# Patient Record
Sex: Female | Born: 1975 | Race: Black or African American | Hispanic: No | Marital: Single | Smoking: Never smoker
Health system: Southern US, Community
[De-identification: ages and names within clinical notes are randomized; demographics above are authoritative.]

## PROBLEM LIST (undated history)

## (undated) ENCOUNTER — Emergency Department (HOSPITAL_COMMUNITY): Admission: EM | Payer: PRIVATE HEALTH INSURANCE | Source: Home / Self Care

## (undated) HISTORY — PX: TUBAL LIGATION: SHX77

---

## 2014-07-20 ENCOUNTER — Encounter (HOSPITAL_COMMUNITY): Payer: Self-pay | Admitting: Emergency Medicine

## 2014-07-20 ENCOUNTER — Emergency Department (HOSPITAL_COMMUNITY)
Admission: EM | Admit: 2014-07-20 | Discharge: 2014-07-21 | Disposition: A | Discharge status: Home / Self Care | Payer: No Typology Code available for payment source | Attending: Emergency Medicine | Admitting: Emergency Medicine

## 2014-07-20 DIAGNOSIS — H02843 Edema of right eye, unspecified eyelid: Secondary | ICD-10-CM | POA: Diagnosis not present

## 2014-07-20 DIAGNOSIS — H02846 Edema of left eye, unspecified eyelid: Secondary | ICD-10-CM | POA: Insufficient documentation

## 2014-07-20 DIAGNOSIS — R220 Localized swelling, mass and lump, head: Secondary | ICD-10-CM | POA: Insufficient documentation

## 2014-07-20 DIAGNOSIS — T7840XA Allergy, unspecified, initial encounter: Secondary | ICD-10-CM

## 2014-07-20 MED ORDER — FAMOTIDINE IN NACL 20-0.9 MG/50ML-% IV SOLN
20.0000 mg | Freq: Once | INTRAVENOUS | Status: AC
  Administered 2014-07-20: 20 mg via INTRAVENOUS
  Filled 2014-07-20: qty 50

## 2014-07-20 MED ORDER — METHYLPREDNISOLONE SODIUM SUCC 125 MG IJ SOLR
125.0000 mg | Freq: Once | INTRAMUSCULAR | Status: AC
  Administered 2014-07-20: 125 mg via INTRAVENOUS
  Filled 2014-07-20: qty 2

## 2014-07-20 MED ORDER — DIPHENHYDRAMINE HCL 50 MG/ML IJ SOLN
25.0000 mg | Freq: Once | INTRAMUSCULAR | Status: AC
  Administered 2014-07-20: 25 mg via INTRAVENOUS
  Filled 2014-07-20: qty 1

## 2014-07-20 NOTE — ED Provider Notes (Signed)
CSN: [id number]     Arrival date & time 07/20/14  2248 History  This chart was scribed for  Marisa Severin, MD by Bethel Born, ED Scribe. This patient was seen in room B16C/B16C and the patient's care was started at 11:35 PM.  The history is provided by the patient. No language interpreter was used.   Tabitha Ritter is a 39 y.o. female who presents to the Emergency Department complaining of an allergic reaction with onset around 4 PM today. Associated symptoms include angioedema. She took a BC powder to treat the symptoms. The pt associates her symptoms with fumes from cleaning chemicals being used at her residence. She has not used any new medication or been bitten by any insects other than mosquitos. Prior to the onset of symptoms she ate a "[employer]" sandwich and baked beans which she has eaten in the past without issue. Pt denies hives, nausea, SOB, and swelling of the tongue or throat. She is breathing and swallowing normally.    History reviewed. No pertinent past medical history. Past Surgical History  Procedure Laterality Date  . Tubal ligation     No family history on file. History  Substance Use Topics  . Smoking status: Never Smoker   . Smokeless tobacco: Not on file  . Alcohol Use: No   OB History    No data available     Review of Systems  Constitutional: Negative for fever, chills, diaphoresis, activity change, appetite change and fatigue.  HENT: Negative for congestion, facial swelling, rhinorrhea and sore throat.        Lip swelling  Eyes: Negative for photophobia and discharge.       Bilateral eye swelling  Respiratory: Negative for cough, chest tightness and shortness of breath.   Cardiovascular: Negative for chest pain, palpitations and leg swelling.  Gastrointestinal: Negative for nausea, vomiting, abdominal pain and diarrhea.  Endocrine: Negative for polydipsia and polyuria.  Genitourinary: Negative for dysuria, frequency, difficulty urinating and pelvic pain.   Musculoskeletal: Negative for back pain, arthralgias, neck pain and neck stiffness.  Skin: Negative for color change and wound.  Allergic/Immunologic: Negative for immunocompromised state.  Neurological: Negative for facial asymmetry, weakness, numbness and headaches.  Hematological: Does not bruise/bleed easily.  Psychiatric/Behavioral: Negative for confusion and agitation.      Allergies  Review of patient's allergies indicates no known allergies.  Home Medications   Prior to Admission medications   Not on File   Triage Vitals: BP 119/85 mmHg  Pulse 76  Temp(Src) 98.4 F (36.9 C) (Oral)  Resp 14  Ht  (1.6 m)  Wt 205 lb (92.987 kg)  BMI 36.32 kg/m2  SpO2 100%  LMP 06/30/2014 Physical Exam  Constitutional: She is oriented to person, place, and time. She appears well-developed and well-nourished. No distress.  HENT:  Head: Normocephalic and atraumatic.  Right Ear: External ear normal.  Left Ear: External ear normal.  Nose: Nose normal.  Mouth/Throat: Oropharynx is clear and moist.  Patient has small amount of swelling to right upper lip  Eyes: Conjunctivae and EOM are normal. Pupils are equal, round, and reactive to light.  Neck: Normal range of motion. Neck supple. No JVD present. No tracheal deviation present. No thyromegaly present.  Cardiovascular: Normal rate, regular rhythm, normal heart sounds and intact distal pulses.  Exam reveals no gallop and no friction rub.   No murmur heard. Pulmonary/Chest: Effort normal and breath sounds normal. No stridor. No respiratory distress. She has no wheezes. She has no  rales. She exhibits no tenderness.  Abdominal: Soft. Bowel sounds are normal. She exhibits no distension and no mass. There is no tenderness. There is no rebound and no guarding.  Musculoskeletal: Normal range of motion. She exhibits no edema or tenderness.  Lymphadenopathy:    She has no cervical adenopathy.  Neurological: She is alert and oriented to  person, place, and time. She displays normal reflexes. She exhibits normal muscle tone. Coordination normal.  Skin: Skin is warm and dry. No rash noted. No erythema. No pallor.  Psychiatric: She has a normal mood and affect. Her behavior is normal. Judgment and thought content normal.  Nursing note and vitals reviewed.   ED Course  Procedures   DIAGNOSTIC STUDIES: Oxygen Saturation is 100% on RA, normal by my interpretation.    COORDINATION OF CARE: 11:37 PM Discussed treatment plan which includes Benadryl, Pepcid, and Solu-Medrol with pt at bedside and pt agreed to plan.  2:07 AM I re-evaluated the patient and provided an update on the treatment plan including discharge to f/u with PCP with prednisone, Benadryl, Pepcid, and Zyrtec.   Labs Review Labs Reviewed - No data to display  Imaging Review No results found.   EKG Interpretation None      MDM   Final diagnoses:  Allergic reaction, initial encounter    I personally performed the services described in this documentation, which was scribed in my presence. The recorded information has been reviewed and is accurate.  39 year old female with right upper lip swelling, no prior history.  No ACE inhibitor's.  No tongue or airway involvement, no hives.  Will treat with cocktail of Benadryl Pepcid Solu-Medrol.  Symptoms have been ongoing since around 3:00, she appears to be in no acute distress.     Marisa Severinlga Antonea Gaut, MD 07/21/14 [phone]

## 2014-07-20 NOTE — ED Notes (Signed)
Pt. reports right upper lip swelling with mild itching  / right facial swelling onset this evening , denies injury , respirations unlabored/ airway intact .

## 2014-07-20 NOTE — ED Notes (Signed)
MD at bedside. 

## 2014-07-21 MED ORDER — CETIRIZINE HCL 10 MG PO TABS
10.0000 mg | ORAL_TABLET | Freq: Every day | ORAL | Status: DC
Start: 2014-07-21 — End: 2019-07-01

## 2014-07-21 MED ORDER — DIPHENHYDRAMINE HCL 25 MG PO TABS: 25.0000 mg | ORAL_TABLET | Freq: Four times a day (QID) | ORAL | Status: DC

## 2014-07-21 MED ORDER — FAMOTIDINE 20 MG PO TABS: 20.0000 mg | ORAL_TABLET | Freq: Two times a day (BID) | ORAL | Status: DC

## 2014-07-21 MED ORDER — PREDNISONE 20 MG PO TABS: 60.0000 mg | ORAL_TABLET | Freq: Every day | ORAL | Status: DC

## 2014-07-21 NOTE — Discharge Instructions (Signed)
Use zyrtec during day, benadryl at night.  Take prednisone and pepcid as prescribed.  Follow up with your doctor for recheck this week.  You may need allergy testing.   Allergies Allergies may happen from anything your body is sensitive to. This may be food, medicines, pollens, chemicals, and nearly anything around you in everyday life that produces allergens. An allergen is anything that causes an allergy producing substance. Heredity is often a factor in causing these problems. This means you may have some of the same allergies as your parents. Food allergies happen in all age groups. Food allergies are some of the most severe and life threatening. Some common food allergies are cow's milk, seafood, eggs, nuts, wheat, and soybeans. SYMPTOMS   Swelling around the mouth.  An itchy red rash or hives.  Vomiting or diarrhea.  Difficulty breathing. SEVERE ALLERGIC REACTIONS ARE LIFE-THREATENING. This reaction is called anaphylaxis. It can cause the mouth and throat to swell and cause difficulty with breathing and swallowing. In severe reactions only a trace amount of food (for example, peanut oil in a salad) may cause death within seconds. Seasonal allergies occur in all age groups. These are seasonal because they usually occur during the same season every year. They may be a reaction to molds, grass pollens, or tree pollens. Other causes of problems are house dust mite allergens, pet dander, and mold spores. The symptoms often consist of nasal congestion, a runny itchy nose associated with sneezing, and tearing itchy eyes. There is often an associated itching of the mouth and ears. The problems happen when you come in contact with pollens and other allergens. Allergens are the particles in the air that the body reacts to with an allergic reaction. This causes you to release allergic antibodies. Through a chain of events, these eventually cause you to release histamine into the blood stream. Although it  is meant to be protective to the body, it is this release that causes your discomfort. This is why you were given anti-histamines to feel better. If you are unable to pinpoint the offending allergen, it may be determined by skin or blood testing. Allergies cannot be cured but can be controlled with medicine. Hay fever is a collection of all or some of the seasonal allergy problems. It may often be treated with simple over-the-counter medicine such as diphenhydramine. Take medicine as directed. Do not drink alcohol or drive while taking this medicine. Check with your caregiver or package insert for child dosages. If these medicines are not effective, there are many new medicines your caregiver can prescribe. Stronger medicine such as nasal spray, eye drops, and corticosteroids may be used if the first things you try do not work well. Other treatments such as immunotherapy or desensitizing injections can be used if all else fails. Follow up with your caregiver if problems continue. These seasonal allergies are usually not life threatening. They are generally more of a nuisance that can often be handled using medicine. HOME CARE INSTRUCTIONS   If unsure what causes a reaction, keep a diary of foods eaten and symptoms that follow. Avoid foods that cause reactions.  If hives or rash are present:  Take medicine as directed.  You may use an over-the-counter antihistamine (diphenhydramine) for hives and itching as needed.  Apply cold compresses (cloths) to the skin or take baths in cool water. Avoid hot baths or showers. Heat will make a rash and itching worse.  If you are severely allergic:  Following a treatment for a  severe reaction, hospitalization is often required for closer follow-up.  Wear a medic-alert bracelet or necklace stating the allergy.  You and your family must learn how to give adrenaline or use an anaphylaxis kit.  If you have had a severe reaction, always carry your anaphylaxis  kit or EpiPen with you. Use this medicine as directed by your caregiver if a severe reaction is occurring. Failure to do so could have a fatal outcome. SEEK MEDICAL CARE IF:  You suspect a food allergy. Symptoms generally happen within 30 minutes of eating a food.  Your symptoms have not gone away within 2 days or are getting worse.  You develop new symptoms.  You want to retest yourself or your child with a food or drink you think causes an allergic reaction. Never do this if an anaphylactic reaction to that food or drink has happened before. Only do this under the care of a caregiver. SEEK IMMEDIATE MEDICAL CARE IF:   You have difficulty breathing, are wheezing, or have a tight feeling in your chest or throat.  You have a swollen mouth, or you have hives, swelling, or itching all over your body.  You have had a severe reaction that has responded to your anaphylaxis kit or an EpiPen. These reactions may return when the medicine has worn off. These reactions should be considered life threatening. MAKE SURE YOU:   Understand these instructions.  Will watch your condition.  Will get help right away if you are not doing well or get worse. Document Released: 03/27/2002 Document Revised: 04/28/2012 Document Reviewed: 09/01/2007 [facility] Patient Information 2015 [place], [place]. This information is not intended to replace advice given to you by your health care provider. Make sure you discuss any questions you have with your health care provider.

## 2017-12-01 ENCOUNTER — Emergency Department (HOSPITAL_BASED_OUTPATIENT_CLINIC_OR_DEPARTMENT_OTHER)
Admission: EM | Admit: 2017-12-01 | Discharge: 2017-12-01 | Disposition: A | Discharge status: Home / Self Care | Payer: BLUE CROSS/BLUE SHIELD | Attending: Emergency Medicine | Admitting: Emergency Medicine

## 2017-12-01 ENCOUNTER — Encounter (HOSPITAL_BASED_OUTPATIENT_CLINIC_OR_DEPARTMENT_OTHER): Payer: Self-pay | Admitting: Emergency Medicine

## 2017-12-01 ENCOUNTER — Other Ambulatory Visit: Payer: Self-pay

## 2017-12-01 DIAGNOSIS — J029 Acute pharyngitis, unspecified: Secondary | ICD-10-CM | POA: Insufficient documentation

## 2017-12-01 DIAGNOSIS — R0981 Nasal congestion: Secondary | ICD-10-CM | POA: Diagnosis not present

## 2017-12-01 DIAGNOSIS — H9203 Otalgia, bilateral: Secondary | ICD-10-CM

## 2017-12-01 DIAGNOSIS — H9209 Otalgia, unspecified ear: Secondary | ICD-10-CM | POA: Diagnosis present

## 2017-12-01 LAB — GROUP A STREP BY PCR: Group A Strep by PCR: NOT DETECTED

## 2017-12-01 MED ORDER — IBUPROFEN 800 MG PO TABS
800.0000 mg | ORAL_TABLET | Freq: Once | ORAL | Status: AC
  Administered 2017-12-01: 800 mg via ORAL
  Filled 2017-12-01: qty 1

## 2017-12-01 MED ORDER — LORATADINE-PSEUDOEPHEDRINE ER 5-120 MG PO TB12: 1.0000 | ORAL_TABLET | Freq: Two times a day (BID) | ORAL | 0 refills | Status: DC

## 2017-12-01 MED ORDER — IBUPROFEN 800 MG PO TABS: 800.0000 mg | ORAL_TABLET | Freq: Three times a day (TID) | ORAL | 0 refills | Status: DC

## 2017-12-01 MED ORDER — TRAMADOL HCL 50 MG PO TABS
50.0000 mg | ORAL_TABLET | Freq: Four times a day (QID) | ORAL | 0 refills | Status: DC | PRN
Start: 2017-12-01 — End: 2019-07-01

## 2017-12-01 NOTE — ED Provider Notes (Signed)
[facility] EMERGENCY DEPARTMENT Provider Note   CSN: [id number] Arrival date & time: 12/01/17  0850     History   Chief Complaint Chief Complaint  Patient presents with  . Sore Throat  . Otalgia    HPI Tabitha Ritter is a 42 y.o. female.  HPI Patient reports that she started to getting earache and sore throat yesterday.  Both ears are achy.  They feel like there is pressure.  She reports the sore throat is burning in quality.  More painful with swallowing.  Minimal associated nasal congestion.  No fever.  No generalized body ache.  No cough no shortness of breath.  No nausea no vomiting.  Patient does work in a healthcare environment with elderly patients.  She does not have any known sick contact that she is aware of.  She does get her annual influenza shot. History reviewed. No pertinent past medical history.  There are no active problems to display for this patient.   Past Surgical History:  Procedure Laterality Date  . TUBAL LIGATION       OB History   None      Home Medications    Prior to Admission medications   Medication Sig Start Date End Date Taking? Authorizing Provider  cetirizine (ZYRTEC ALLERGY) 10 MG tablet Take 1 tablet (10 mg total) by mouth daily. 07/21/14   Marisa Severin, MD  diphenhydrAMINE (BENADRYL) 25 MG tablet Take 1 tablet (25 mg total) by mouth every 6 (six) hours. 07/21/14   Marisa Severin, MD  famotidine (PEPCID) 20 MG tablet Take 1 tablet (20 mg total) by mouth 2 (two) times daily. 07/21/14   Marisa Severin, MD  ibuprofen (ADVIL,MOTRIN) 800 MG tablet Take 1 tablet (800 mg total) by mouth 3 (three) times daily. 12/01/17   Arby Barrette, MD  loratadine-pseudoephedrine (CLARITIN-D 12 HOUR) 5-120 MG tablet Take 1 tablet by mouth 2 (two) times daily. 12/01/17   Arby Barrette, MD  predniSONE (DELTASONE) 20 MG tablet Take 3 tablets (60 mg total) by mouth daily. 07/21/14   Marisa Severin, MD  traMADol (ULTRAM) 50 MG tablet Take 1 tablet (50 mg  total) by mouth every 6 (six) hours as needed. 12/01/17   Arby Barrette, MD    Family History No family history on file.  Social History Social History   Tobacco Use  . Smoking status: Never Smoker  . Smokeless tobacco: Never Used  Substance Use Topics  . Alcohol use: No  . Drug use: No     Allergies   Patient has no known allergies.   Review of Systems Review of Systems 10 Systems reviewed and are negative for acute change except as noted in the HPI.   Physical Exam Updated Vital Signs BP 124/85 (BP Location: Right Arm)   Pulse 97   Temp 98 F (36.7 C) (Oral)   Resp 14   Ht 5\' 3"  (1.6 m)   Wt 96.2 kg   LMP 11/07/2017   SpO2 100%   BMI 37.55 kg/m   Physical Exam  Constitutional: She is oriented to person, place, and time. She appears well-developed and well-nourished. No distress.  HENT:  Head: Normocephalic and atraumatic.  Bilateral TMs are normal.  Mucous membranes are pink and moist.  Posterior oropharynx is widely patent.  No erythema no exudate.  Neck is supple.  Eyes: Pupils are equal, round, and reactive to light. EOM are normal.  Neck: Neck supple.  Cardiovascular: Normal rate, regular rhythm and normal heart sounds.  Pulmonary/Chest:  Effort normal and breath sounds normal.  Abdominal: Soft. She exhibits no distension. There is no tenderness.  Musculoskeletal: Normal range of motion.  Neurological: She is alert and oriented to person, place, and time. She exhibits normal muscle tone. Coordination normal.  Skin: Skin is warm and dry.  Psychiatric: She has a normal mood and affect.     ED Treatments / Results  Labs (all labs ordered are listed, but only abnormal results are displayed) Labs Reviewed  GROUP A STREP BY PCR    EKG None  Radiology No results found.  Procedures Procedures (including critical care time)  Medications Ordered in ED Medications  ibuprofen (ADVIL,MOTRIN) tablet 800 mg (800 mg Oral Given 12/01/17 0958)      Initial Impression / Assessment and Plan / ED Course  I have reviewed the triage vital signs and the nursing notes.  Pertinent labs & imaging results that were available during my care of the patient were reviewed by me and considered in my medical decision making (see chart for details).    Rapid strep is negative.  Patient does not have findings positive for otitis media on exam.  Her throat is clear.  At this time have high suspicion for viral etiology.  Patient does not have fever or generalized myalgia.  Will initiate symptomatic treatment with combination of ibuprofen Claritin-D and tramadol as needed.  Return precautions reviewed.  Patient is counseled to follow-up with her PCP.  Final Clinical Impressions(s) / ED Diagnoses   Final diagnoses:  Pharyngitis, unspecified etiology  Otalgia of both ears    ED Discharge Orders         Ordered    ibuprofen (ADVIL,MOTRIN) 800 MG tablet  3 times daily     12/01/17 1042    traMADol (ULTRAM) 50 MG tablet  Every 6 hours PRN     12/01/17 1042    loratadine-pseudoephedrine (CLARITIN-D 12 HOUR) 5-120 MG tablet  2 times daily     12/01/17 1042           Arby BarrettePfeiffer, Alba Kriesel, MD 12/01/17 1046

## 2017-12-01 NOTE — ED Triage Notes (Signed)
Pt c/o pain to B/L ears and sore throat onset yesterday. Pt has not tried any medication for symptoms.

## 2019-01-07 ENCOUNTER — Other Ambulatory Visit: Payer: Self-pay

## 2019-01-07 ENCOUNTER — Encounter (HOSPITAL_BASED_OUTPATIENT_CLINIC_OR_DEPARTMENT_OTHER): Payer: Self-pay

## 2019-01-07 ENCOUNTER — Emergency Department (HOSPITAL_BASED_OUTPATIENT_CLINIC_OR_DEPARTMENT_OTHER)
Admission: EM | Admit: 2019-01-07 | Discharge: 2019-01-07 | Disposition: A | Discharge status: Home / Self Care | Payer: BLUE CROSS/BLUE SHIELD | Attending: Emergency Medicine | Admitting: Emergency Medicine

## 2019-01-07 DIAGNOSIS — M7989 Other specified soft tissue disorders: Secondary | ICD-10-CM

## 2019-01-07 DIAGNOSIS — Z79899 Other long term (current) drug therapy: Secondary | ICD-10-CM | POA: Insufficient documentation

## 2019-01-07 DIAGNOSIS — R2232 Localized swelling, mass and lump, left upper limb: Secondary | ICD-10-CM | POA: Insufficient documentation

## 2019-01-07 MED ORDER — IBUPROFEN 800 MG PO TABS
800.0000 mg | ORAL_TABLET | Freq: Once | ORAL | Status: AC
  Administered 2019-01-07: 800 mg via ORAL
  Filled 2019-01-07: qty 1

## 2019-01-07 MED ORDER — CLINDAMYCIN HCL 150 MG PO CAPS
300.0000 mg | ORAL_CAPSULE | Freq: Once | ORAL | Status: AC
  Administered 2019-01-07: 300 mg via ORAL
  Filled 2019-01-07: qty 2

## 2019-01-07 MED ORDER — IBUPROFEN 600 MG PO TABS: 600.0000 mg | ORAL_TABLET | Freq: Four times a day (QID) | ORAL | 0 refills | Status: DC | PRN

## 2019-01-07 MED ORDER — OXYCODONE-ACETAMINOPHEN 5-325 MG PO TABS: 2.0000 | ORAL_TABLET | ORAL | 0 refills | Status: DC | PRN

## 2019-01-07 MED ORDER — CLINDAMYCIN HCL 150 MG PO CAPS: 300.0000 mg | ORAL_CAPSULE | Freq: Three times a day (TID) | ORAL | 0 refills | Status: AC

## 2019-01-07 MED FILL — IBUPROFEN 600 MG TABLET: 600 | 7 days supply | Qty: 30 | Fill #0

## 2019-01-07 MED FILL — CLINDAMYCIN HCL 150 MG CAPS: 150 | 7 days supply | Qty: 42 | Fill #0

## 2019-01-07 MED FILL — OXYCODONE-ACETAMINOPHEN 5-3: 5-325 | 1 days supply | Qty: 5 | Fill #0

## 2019-01-07 NOTE — ED Triage Notes (Signed)
Pt states that she has had a boil under her left axilla X 3-4 works. States that she has been using warm compresses but on Saturday the area became more pain painful.

## 2019-01-07 NOTE — ED Provider Notes (Signed)
[facility] EMERGENCY DEPARTMENT Provider Note   CSN: [id number] Arrival date & time: 01/07/19  5573     History Chief Complaint  Patient presents with  . Recurrent Skin Infections    Tabitha Ritter is a 43 y.o. female presented to the ED with pain and swelling under her left armpit.  She reports onset of symptoms about 3 weeks ago.  She has been attempting warm compresses at home but feels like the swelling is just getting larger.  She said is very painful with any movement of her arm.  She denies any fevers or chills.  She does get 1 similar issue in the past with a "boil" under her armpit after shaving her armpits.  She has not recently been shaving her armpits.  She has no other history of boils or infections.  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   HPI     History reviewed. No pertinent past medical history.  There are no problems to display for this patient.   Past Surgical History:  Procedure Laterality Date  . TUBAL LIGATION       OB History   No obstetric history on file.     History reviewed. No pertinent family history.  Social History   Tobacco Use  . Smoking status: Never Smoker  . Smokeless tobacco: Never Used  Substance Use Topics  . Alcohol use: No  . Drug use: No    Home Medications Prior to Admission medications   Medication Sig Start Date End Date Taking? Authorizing Provider  cetirizine (ZYRTEC ALLERGY) 10 MG tablet Take 1 tablet (10 mg total) by mouth daily. 07/21/14   Marisa Severin, MD  clindamycin (CLEOCIN) 150 MG capsule Take 2 capsules (300 mg total) by mouth 3 (three) times daily for 7 days. 01/07/19 01/14/19  Terald Sleeper, MD  diphenhydrAMINE (BENADRYL) 25 MG tablet Take 1 tablet (25 mg total) by mouth every 6 (six) hours. 07/21/14   Marisa Severin, MD  famotidine (PEPCID) 20 MG tablet Take 1 tablet (20 mg total) by mouth 2 (two) times daily. 07/21/14   Marisa Severin, MD  ibuprofen (ADVIL) 600 MG tablet Take 1  tablet (600 mg total) by mouth every 6 (six) hours as needed for up to 30 doses. 01/07/19   Terald Sleeper, MD  ibuprofen (ADVIL,MOTRIN) 800 MG tablet Take 1 tablet (800 mg total) by mouth 3 (three) times daily. 12/01/17   Arby Barrette, MD  loratadine-pseudoephedrine (CLARITIN-D 12 HOUR) 5-120 MG tablet Take 1 tablet by mouth 2 (two) times daily. 12/01/17   Arby Barrette, MD  oxyCODONE-acetaminophen (PERCOCET/ROXICET) 5-325 MG tablet Take 2 tablets by mouth every 4 (four) hours as needed for up to 5 doses for severe pain. 01/07/19   Terald Sleeper, MD  predniSONE (DELTASONE) 20 MG tablet Take 3 tablets (60 mg total) by mouth daily. 07/21/14   Marisa Severin, MD  traMADol (ULTRAM) 50 MG tablet Take 1 tablet (50 mg total) by mouth every 6 (six) hours as needed. 12/01/17   Arby Barrette, MD    Allergies    Patient has no known allergies.  Review of Systems   Review of Systems  Constitutional: Negative for chills and fever.  Respiratory: Negative for cough and shortness of breath.   Cardiovascular: Negative for chest pain and palpitations.  Gastrointestinal: Negative for nausea and vomiting.  Musculoskeletal: Positive for arthralgias and myalgias.  Skin: Positive for wound. Negative for rash.  Psychiatric/Behavioral: Negative for agitation  and confusion.  All other systems reviewed and are negative.   Physical Exam Updated Vital Signs BP 132/88 (BP Location: Right Arm)   Pulse 91   Temp 97.9 F (36.6 C) (Oral)   Resp 14   Ht 5\' 3"  (1.6 m)   Wt 96.2 kg   LMP 01/03/2019   SpO2 100%   BMI 37.55 kg/m   Physical Exam Vitals and nursing note reviewed.  Constitutional:      General: She is not in acute distress.    Appearance: She is well-developed.  HENT:     Head: Normocephalic and atraumatic.  Eyes:     Conjunctiva/sclera: Conjunctivae normal.  Cardiovascular:     Rate and Rhythm: Normal rate and regular rhythm.  Pulmonary:     Effort: Pulmonary effort is normal. No  respiratory distress.  Musculoskeletal:     Cervical back: Neck supple.  Skin:    General: Skin is warm and dry.     Comments: Large tender warm nodule of the left axilla  Neurological:     Mental Status: She is alert.  Psychiatric:        Mood and Affect: Mood normal.        Behavior: Behavior normal.     ED Results / Procedures / Treatments   Labs (all labs ordered are listed, but only abnormal results are displayed) Labs Reviewed - No data to display  EKG None  Radiology No results found.  Procedures Ultrasound ED Soft Tissue  Date/Time: 01/07/2019 8:20 AM Performed by: Wyvonnia Dusky, MD Authorized by: Wyvonnia Dusky, MD   Procedure details:    Indications: localization of abscess and evaluate for cellulitis     Transverse view:  Visualized   Longitudinal view:  Visualized   Images: archived   Location:    Location: axilla     Side:  Left Comments:     Mixed hypo and hyperechoic fluid structure identified with vascular flow most consistent with lymph node   (including critical care time)  Medications Ordered in ED Medications  clindamycin (CLEOCIN) capsule 300 mg (300 mg Oral Given 01/07/19 0830)  ibuprofen (ADVIL) tablet 800 mg (800 mg Oral Given 01/07/19 0830)    ED Course  I have reviewed the triage vital signs and the nursing notes.  Pertinent labs & imaging results that were available during my care of the patient were reviewed by me and considered in my medical decision making (see chart for details).  This is a 43 year old female presenting with pain in her left armpit for about 3 weeks.  She has a large tender nodule in her left axilla; it is unclear from physical exam alone whether this is truly an abscess or if it is a reactive lymph node.  However, bedside ultrasound did show that there was some vascular flow into this structure, which is more concerning for lymphadenitis.  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     Final Clinical Impression(s) / ED Diagnoses Final diagnoses:  Swelling in left armpit    Rx / DC Orders ED Discharge Orders         Ordered    clindamycin (CLEOCIN) 150 MG capsule  3 times daily     01/07/19  [id number]    ibuprofen (ADVIL) 600 MG tablet  Every 6 hours PRN     01/07/19 0826    oxyCODONE-acetaminophen (PERCOCET/ROXICET) 5-325 MG tablet  Every 4 hours PRN     01/07/19 0826           Terald Sleeperrifan, Om Lizotte J, MD 01/07/19 [phone]

## 2019-01-07 NOTE — Discharge Instructions (Signed)
You were seen in the ED for pain and swelling under your left armpit.  He had an ultrasound done in the ED.  As I explained, based on the ultrasound, I was concerned that this may in fact be a reactive lymph node and not an abscess.  I decided not to perform incision and drainage in the emergency department.  Instead I prescribed you antibiotics and some anti-inflammatories to take for a week.  I advised that you follow-up with a general surgeon if you continue to have persistent swelling.

## 2019-01-19 ENCOUNTER — Emergency Department (HOSPITAL_BASED_OUTPATIENT_CLINIC_OR_DEPARTMENT_OTHER): Payer: BLUE CROSS/BLUE SHIELD

## 2019-01-19 ENCOUNTER — Encounter (HOSPITAL_BASED_OUTPATIENT_CLINIC_OR_DEPARTMENT_OTHER): Payer: Self-pay | Admitting: Emergency Medicine

## 2019-01-19 ENCOUNTER — Emergency Department (HOSPITAL_BASED_OUTPATIENT_CLINIC_OR_DEPARTMENT_OTHER)
Admission: EM | Admit: 2019-01-19 | Discharge: 2019-01-19 | Disposition: A | Discharge status: Home / Self Care | Payer: BLUE CROSS/BLUE SHIELD | Attending: Emergency Medicine | Admitting: Emergency Medicine

## 2019-01-19 ENCOUNTER — Other Ambulatory Visit: Payer: Self-pay

## 2019-01-19 DIAGNOSIS — U071 COVID-19: Secondary | ICD-10-CM | POA: Diagnosis not present

## 2019-01-19 DIAGNOSIS — Z79899 Other long term (current) drug therapy: Secondary | ICD-10-CM | POA: Diagnosis not present

## 2019-01-19 DIAGNOSIS — R05 Cough: Secondary | ICD-10-CM | POA: Diagnosis present

## 2019-01-19 DIAGNOSIS — R42 Dizziness and giddiness: Secondary | ICD-10-CM | POA: Diagnosis not present

## 2019-01-19 DIAGNOSIS — R519 Headache, unspecified: Secondary | ICD-10-CM | POA: Diagnosis not present

## 2019-01-19 DIAGNOSIS — W19XXXA Unspecified fall, initial encounter: Secondary | ICD-10-CM

## 2019-01-19 LAB — CBC WITH DIFFERENTIAL/PLATELET
Abs Immature Granulocytes: 0.02 10*3/uL (ref 0.00–0.07)
Basophils Absolute: 0 10*3/uL (ref 0.0–0.1)
Basophils Relative: 0 %
Eosinophils Absolute: 0 10*3/uL (ref 0.0–0.5)
Eosinophils Relative: 0 %
HCT: 42.8 % (ref 36.0–46.0)
Hemoglobin: 13.5 g/dL (ref 12.0–15.0)
Immature Granulocytes: 0 %
Lymphocytes Relative: 24 %
Lymphs Abs: 1.6 10*3/uL (ref 0.7–4.0)
MCH: 21.9 pg — ABNORMAL LOW (ref 26.0–34.0)
MCHC: 31.5 g/dL (ref 30.0–36.0)
MCV: 69.5 fL — ABNORMAL LOW (ref 80.0–100.0)
Monocytes Absolute: 0.4 10*3/uL (ref 0.1–1.0)
Monocytes Relative: 6 %
Neutro Abs: 4.7 10*3/uL (ref 1.7–7.7)
Neutrophils Relative %: 70 %
Platelets: 267 10*3/uL (ref 150–400)
RBC: 6.16 MIL/uL — ABNORMAL HIGH (ref 3.87–5.11)
RDW: 14.2 % (ref 11.5–15.5)
Smear Review: NORMAL
WBC: 6.7 10*3/uL (ref 4.0–10.5)
nRBC: 0 % (ref 0.0–0.2)

## 2019-01-19 LAB — COMPREHENSIVE METABOLIC PANEL
ALT: 22 U/L (ref 0–44)
AST: 67 U/L — ABNORMAL HIGH (ref 15–41)
Albumin: 4.1 g/dL (ref 3.5–5.0)
Alkaline Phosphatase: 30 U/L — ABNORMAL LOW (ref 38–126)
Anion gap: 13 (ref 5–15)
BUN: 14 mg/dL (ref 6–20)
CO2: 27 mmol/L (ref 22–32)
Calcium: 8.8 mg/dL — ABNORMAL LOW (ref 8.9–10.3)
Chloride: 93 mmol/L — ABNORMAL LOW (ref 98–111)
Creatinine, Ser: 1.1 mg/dL — ABNORMAL HIGH (ref 0.44–1.00)
GFR calc Af Amer: >60 mL/min (ref >60)
GFR calc non Af Amer: >60 mL/min (ref >60)
Glucose, Bld: 102 mg/dL — ABNORMAL HIGH (ref 70–99)
Potassium: 3.8 mmol/L (ref 3.5–5.1)
Sodium: 133 mmol/L — ABNORMAL LOW (ref 135–145)
Total Bilirubin: 1.5 mg/dL — ABNORMAL HIGH (ref 0.3–1.2)
Total Protein: 8.9 g/dL — ABNORMAL HIGH (ref 6.5–8.1)

## 2019-01-19 MED ORDER — SODIUM CHLORIDE 0.9 % IV BOLUS
1000.0000 mL | Freq: Once | INTRAVENOUS | Status: AC
  Administered 2019-01-19: 1000 mL via INTRAVENOUS

## 2019-01-19 NOTE — ED Triage Notes (Signed)
she tested positive for COVID+ om 12/26 - pt reports that she fell on Friday when she was in her bedroom, the pt states that she continues to have a Headache and a cough after the fall

## 2019-01-19 NOTE — Discharge Instructions (Addendum)
We discussed the results of your xray.  Please continue to drink plenty of fluids in order to help with your symptoms.   If you experience any shortness of breath, chest pain please return to the ED.

## 2019-01-19 NOTE — ED Provider Notes (Signed)
[facility] EMERGENCY DEPARTMENT Provider Note   CSN: [id number] Arrival date & time: 01/19/19  1047     History Chief Complaint  Patient presents with  . Cough    Tabitha Ritter is a 44 y.o. female.  44 y.o female with no PMH presents to the ED with a chief complaint of headache x 2 days. Patient reports falling on the floor late at night Saturday, states she felt dizzy and struck her head on the ground. She describes a sharp pain around the back of her head with radiation throughout. She has taken ibuprofen with some improvement in symptoms, bringing her pain down to a 2. She reports body aches, fevers while at home with a T max of 99.8. She is currently on day 9 of covid 19 infection. She denies any chest pain, shortness of breath, blurry vision.   The history is provided by the patient.  Cough Cough characteristics:  Dry Sputum characteristics:  Nondescript Severity:  Moderate Onset quality:  Gradual Associated symptoms: headaches   Associated symptoms: no chest pain and no sore throat       Past Surgical History:  Procedure Laterality Date  . TUBAL LIGATION       OB History   No obstetric history on file.     History reviewed. No pertinent family history.  Social History   Tobacco Use  . Smoking status: Never Smoker  . Smokeless tobacco: Never Used  Substance Use Topics  . Alcohol use: No  . Drug use: No    Home Medications Prior to Admission medications   Medication Sig Start Date End Date Taking? Authorizing Provider  cetirizine (ZYRTEC ALLERGY) 10 MG tablet Take 1 tablet (10 mg total) by mouth daily. 07/21/14   Marisa Severin, MD  diphenhydrAMINE (BENADRYL) 25 MG tablet Take 1 tablet (25 mg total) by mouth every 6 (six) hours. 07/21/14   Marisa Severin, MD  famotidine (PEPCID) 20 MG tablet Take 1 tablet (20 mg total) by mouth 2 (two) times daily. 07/21/14   Marisa Severin, MD  ibuprofen (ADVIL) 600 MG tablet Take 1 tablet (600 mg total) by mouth every 6  (six) hours as needed for up to 30 doses. 01/07/19   Terald Sleeper, MD  ibuprofen (ADVIL,MOTRIN) 800 MG tablet Take 1 tablet (800 mg total) by mouth 3 (three) times daily. 12/01/17   Arby Barrette, MD  loratadine-pseudoephedrine (CLARITIN-D 12 HOUR) 5-120 MG tablet Take 1 tablet by mouth 2 (two) times daily. 12/01/17   Arby Barrette, MD  oxyCODONE-acetaminophen (PERCOCET/ROXICET) 5-325 MG tablet Take 2 tablets by mouth every 4 (four) hours as needed for up to 5 doses for severe pain. 01/07/19   Terald Sleeper, MD  predniSONE (DELTASONE) 20 MG tablet Take 3 tablets (60 mg total) by mouth daily. 07/21/14   Marisa Severin, MD  traMADol (ULTRAM) 50 MG tablet Take 1 tablet (50 mg total) by mouth every 6 (six) hours as needed. 12/01/17   Arby Barrette, MD    Allergies    Patient has no known allergies.  Review of Systems   Review of Systems  HENT: Negative for sore throat.   Respiratory: Positive for cough and choking. Negative for chest tightness.   Cardiovascular: Negative for chest pain.  Gastrointestinal: Negative for abdominal pain.  Genitourinary: Negative for flank pain.  Musculoskeletal: Negative for back pain.  Skin: Negative for pallor.  Neurological: Positive for headaches.    Physical Exam Updated Vital Signs BP 105/80 (BP Location: Right Arm)  Pulse (!) 119   Temp 99 F (37.2 C) (Oral)   Resp 18   Wt 96.1 kg   LMP 01/03/2019   SpO2 100%   BMI 37.54 kg/m   Physical Exam Vitals and nursing note reviewed.  Constitutional:      Appearance: Normal appearance.  HENT:     Head: Normocephalic and atraumatic.     Mouth/Throat:     Mouth: Mucous membranes are dry.  Eyes:     Pupils: Pupils are equal, round, and reactive to light.  Cardiovascular:     Rate and Rhythm: Tachycardia present.  Pulmonary:     Effort: Pulmonary effort is normal.     Breath sounds: No wheezing or rales.  Abdominal:     General: Abdomen is flat.  Musculoskeletal:     Cervical back:  Normal range of motion and neck supple.  Skin:    General: Skin is warm and dry.  Neurological:     Mental Status: She is alert and oriented to person, place, and time.     ED Results / Procedures / Treatments   Labs (all labs ordered are listed, but only abnormal results are displayed) Labs Reviewed  CBC WITH DIFFERENTIAL/PLATELET - Abnormal; Notable for the following components:      Result Value   RBC 6.16 (*)    MCV 69.5 (*)    MCH 21.9 (*)    All other components within normal limits  COMPREHENSIVE METABOLIC PANEL - Abnormal; Notable for the following components:   Sodium 133 (*)    Chloride 93 (*)    Glucose, Bld 102 (*)    Creatinine, Ser 1.10 (*)    Calcium 8.8 (*)    Total Protein 8.9 (*)    AST 67 (*)    Alkaline Phosphatase 30 (*)    Total Bilirubin 1.5 (*)    All other components within normal limits    EKG None  Radiology DG Chest Portable 1 View  Result Date: 01/19/2019 CLINICAL DATA:  COVID-19 positivity with recent fall and cough, initial encounter EXAM: PORTABLE CHEST 1 VIEW COMPARISON:  None. FINDINGS: Cardiac shadows within normal limits. Very minimal patchy opacities are noted in the right mid lung consistent with the given clinical history. No sizable effusion is seen. No bony abnormality is noted. IMPRESSION: Patchy opacities are noted consistent with the given clinical history. Electronically Signed   By: Alcide Clever M.D.   On: 01/19/2019 16:04    Procedures Procedures (including critical care time)  Medications Ordered in ED Medications  sodium chloride 0.9 % bolus 1,000 mL (1,000 mLs Intravenous New Bag/Given 01/19/19 1629)    ED Course  I have reviewed the triage vital signs and the nursing notes.  Pertinent labs & imaging results that were available during my care of the patient were reviewed by me and considered in my medical decision making (see chart for details).    MDM Rules/Calculators/A&P    Patient with no PMH presents to the  ED status post fall, patient reports she felt dizzy over the weekend and had a fall, reports she hit her head, she is currently not on any blood thinners.  She did not lose consciousness and has been ambulatory since the incident. She also tested positive for covid on 12/26, has been managing her symptoms at home. She arrived in the ED with a HR of 120 at rest, no tachypnea, no hypoxia, she is ventilating well without any increase work of breathing.   CMP showed  mild hyponatremia, mild AKI suspect likely due to dehydration. LFT's remarkable for a mild elevated AST.   Xray of the chest showed: Patchy opacities are noted consistent with the given clinical  history.      5:06 PM Patient was reevaluated by me, she reports improvement in symptoms after IV fluids encourage to continue to push fluids. Neuro exam is unremarkable.  We discussed the risks and benefits of obtaining CT imaging at this time.  Patient is agreeable of monitoring headache at home.  Heart rate has improved now to 108, she is satting at 100%, no signs of hypoxia, chest pain, fevers. Return precautions discussed at length.    Portions of this note were generated with [employer]. Dictation errors may occur despite best attempts at proofreading.  Final Clinical Impression(s) / ED Diagnoses Final diagnoses:  Fall, initial encounter  COVID-19 virus infection    Rx / DC Orders ED Discharge Orders    None       Janeece Fitting, PA-C 01/19/19 1712    Dorie Rank, MD 01/20/19 [phone]

## 2019-01-19 NOTE — ED Notes (Signed)
PA to the bedside. Pt in no noted distress

## 2019-02-10 ENCOUNTER — Other Ambulatory Visit: Payer: Self-pay

## 2019-02-10 ENCOUNTER — Emergency Department (HOSPITAL_BASED_OUTPATIENT_CLINIC_OR_DEPARTMENT_OTHER)
Admission: EM | Admit: 2019-02-10 | Discharge: 2019-02-10 | Disposition: A | Discharge status: Home / Self Care | Payer: BC Managed Care – PPO | Attending: Emergency Medicine | Admitting: Emergency Medicine

## 2019-02-10 ENCOUNTER — Encounter (HOSPITAL_BASED_OUTPATIENT_CLINIC_OR_DEPARTMENT_OTHER): Payer: Self-pay | Admitting: *Deleted

## 2019-02-10 DIAGNOSIS — Z79899 Other long term (current) drug therapy: Secondary | ICD-10-CM | POA: Diagnosis not present

## 2019-02-10 DIAGNOSIS — T50Z95A Adverse effect of other vaccines and biological substances, initial encounter: Secondary | ICD-10-CM | POA: Diagnosis not present

## 2019-02-10 DIAGNOSIS — R0602 Shortness of breath: Secondary | ICD-10-CM | POA: Diagnosis not present

## 2019-02-10 DIAGNOSIS — Z8616 Personal history of COVID-19: Secondary | ICD-10-CM | POA: Diagnosis not present

## 2019-02-10 DIAGNOSIS — M791 Myalgia, unspecified site: Secondary | ICD-10-CM | POA: Diagnosis not present

## 2019-02-10 DIAGNOSIS — R0789 Other chest pain: Secondary | ICD-10-CM | POA: Insufficient documentation

## 2019-02-10 NOTE — Discharge Instructions (Signed)
Suspect the symptoms you are having today are related to getting the Moderna vaccine as it is activating all the antibodies you had already developed from having Covid.  Take Tylenol or ibuprofen for body aches and pains.  This could last up to 24 to 36 hours.  You may need to pretreat before getting your second vaccine.

## 2019-02-10 NOTE — ED Provider Notes (Signed)
[facility] EMERGENCY DEPARTMENT Provider Note   CSN: [id number] Arrival date & time: 02/10/19  1843     History Chief Complaint  Patient presents with  . Allergic Reaction    Tabitha Ritter is a 44 y.o. female.  Patient is a 44 year old female with no significant past medical history who is presenting today with a concern that she may be having an allergic reaction.  Patient states that she got her [place] vaccine this morning but when she woke up tonight to get ready for work she felt some tightness in her chest some mild shortness of breath and some body aches.  She states the symptoms felt very similar to when she had Covid at the end of December.  She was feeling completely well prior to getting the vaccine this morning and directly after getting the vaccine she had no symptoms.  She had no flushing, rashes or chest tightness.  It was not till 12 hours after the vaccine she started having symptoms.  She states now she just has some arm soreness but the other symptoms have improved.  The history is provided by the patient.  Allergic Reaction      History reviewed. No pertinent past medical history.  There are no problems to display for this patient.   Past Surgical History:  Procedure Laterality Date  . TUBAL LIGATION       OB History   No obstetric history on file.     No family history on file.  Social History   Tobacco Use  . Smoking status: Never Smoker  . Smokeless tobacco: Never Used  Substance Use Topics  . Alcohol use: No  . Drug use: No    Home Medications Prior to Admission medications   Medication Sig Start Date End Date Taking? Authorizing Provider  cetirizine (ZYRTEC ALLERGY) 10 MG tablet Take 1 tablet (10 mg total) by mouth daily. 07/21/14   Linton Flemings, MD  diphenhydrAMINE (BENADRYL) 25 MG tablet Take 1 tablet (25 mg total) by mouth every 6 (six) hours. 07/21/14   Linton Flemings, MD  famotidine (PEPCID) 20 MG tablet Take 1 tablet (20 mg  total) by mouth 2 (two) times daily. 07/21/14   Linton Flemings, MD  ibuprofen (ADVIL) 600 MG tablet Take 1 tablet (600 mg total) by mouth every 6 (six) hours as needed for up to 30 doses. 01/07/19   Wyvonnia Dusky, MD  ibuprofen (ADVIL,MOTRIN) 800 MG tablet Take 1 tablet (800 mg total) by mouth 3 (three) times daily. 12/01/17   Charlesetta Shanks, MD  loratadine-pseudoephedrine (CLARITIN-D 12 HOUR) 5-120 MG tablet Take 1 tablet by mouth 2 (two) times daily. 12/01/17   Charlesetta Shanks, MD  oxyCODONE-acetaminophen (PERCOCET/ROXICET) 5-325 MG tablet Take 2 tablets by mouth every 4 (four) hours as needed for up to 5 doses for severe pain. 01/07/19   Wyvonnia Dusky, MD  predniSONE (DELTASONE) 20 MG tablet Take 3 tablets (60 mg total) by mouth daily. 07/21/14   Linton Flemings, MD  traMADol (ULTRAM) 50 MG tablet Take 1 tablet (50 mg total) by mouth every 6 (six) hours as needed. 12/01/17   Charlesetta Shanks, MD    Allergies    Patient has no known allergies.  Review of Systems   Review of Systems  All other systems reviewed and are negative.   Physical Exam Updated Vital Signs BP (!) 140/97   Pulse (!) 55   Temp 98.2 F (36.8 C) (Oral)   Resp 20   Ht 5'  3" (1.6 m)   Wt 94.3 kg   LMP 01/13/2019   SpO2 98%   BMI 36.85 kg/m   Physical Exam Vitals and nursing note reviewed.  Constitutional:      General: She is not in acute distress.    Appearance: Normal appearance. She is well-developed and normal weight.  HENT:     Head: Normocephalic and atraumatic.  Eyes:     Pupils: Pupils are equal, round, and reactive to light.  Cardiovascular:     Rate and Rhythm: Normal rate and regular rhythm.     Heart sounds: Normal heart sounds. No murmur. No friction rub.  Pulmonary:     Effort: Pulmonary effort is normal.     Breath sounds: Normal breath sounds. No wheezing or rales.  Musculoskeletal:        General: No tenderness. Normal range of motion.     Comments: No edema  Skin:    General: Skin is  warm and dry.     Findings: No rash.  Neurological:     General: No focal deficit present.     Mental Status: She is alert and oriented to person, place, and time. Mental status is at baseline.     Cranial Nerves: No cranial nerve deficit.  Psychiatric:        Mood and Affect: Mood normal.        Behavior: Behavior normal.        Thought Content: Thought content normal.     ED Results / Procedures / Treatments   Labs (all labs ordered are listed, but only abnormal results are displayed) Labs Reviewed - No data to display  EKG None  Radiology No results found.  Procedures Procedures (including critical care time)  Medications Ordered in ED Medications - No data to display  ED Course  I have reviewed the triage vital signs and the nursing notes.  Pertinent labs & imaging results that were available during my care of the patient were reviewed by me and considered in my medical decision making (see chart for details).    MDM Rules/Calculators/A&P                     44 year old female with no significant past medical history except for having Covid at the end of December presenting today with concern for an allergic reaction.  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  She had some tightness in her chest, some shortness of breath and some generalized aches.  She states now her arm is just sore but the other symptoms have improved.  She has no rashes, urticaria, facial swelling, tongue swelling or wheezing at this time.  Suspect this is a normal reaction to the vaccine as patient has already had Covid.  Explained this to the patient and also recommended when she gets her second dose that she should pretreat with Tylenol or Motrin.  There is no evidence of anaphylaxis at this time.  Patient is otherwise well-appearing.  Feel that she is safe  for discharge.  Final Clinical Impression(s) / ED Diagnoses Final diagnoses:  Vaccine reaction, initial encounter    Rx / DC Orders ED Discharge Orders    None       Gwyneth Sprout, MD 02/10/19 [phone]

## 2019-02-10 NOTE — ED Triage Notes (Signed)
She had her Covid vaccine today. She has had palpitations this afternoon.

## 2019-04-10 ENCOUNTER — Encounter (HOSPITAL_BASED_OUTPATIENT_CLINIC_OR_DEPARTMENT_OTHER): Payer: Self-pay | Admitting: Emergency Medicine

## 2019-04-10 ENCOUNTER — Other Ambulatory Visit: Payer: Self-pay

## 2019-04-10 ENCOUNTER — Emergency Department (HOSPITAL_BASED_OUTPATIENT_CLINIC_OR_DEPARTMENT_OTHER): Payer: BC Managed Care – PPO

## 2019-04-10 ENCOUNTER — Emergency Department (HOSPITAL_BASED_OUTPATIENT_CLINIC_OR_DEPARTMENT_OTHER)
Admission: EM | Admit: 2019-04-10 | Discharge: 2019-04-10 | Disposition: A | Discharge status: Home / Self Care | Payer: BC Managed Care – PPO | Attending: Emergency Medicine | Admitting: Emergency Medicine

## 2019-04-10 DIAGNOSIS — R05 Cough: Secondary | ICD-10-CM | POA: Diagnosis present

## 2019-04-10 DIAGNOSIS — R630 Anorexia: Secondary | ICD-10-CM | POA: Insufficient documentation

## 2019-04-10 DIAGNOSIS — R0789 Other chest pain: Secondary | ICD-10-CM

## 2019-04-10 DIAGNOSIS — R197 Diarrhea, unspecified: Secondary | ICD-10-CM | POA: Diagnosis not present

## 2019-04-10 LAB — CBC WITH DIFFERENTIAL/PLATELET
Abs Immature Granulocytes: 0.01 10*3/uL (ref 0.00–0.07)
Basophils Absolute: 0 10*3/uL (ref 0.0–0.1)
Basophils Relative: 1 %
Eosinophils Absolute: 0 10*3/uL (ref 0.0–0.5)
Eosinophils Relative: 0 %
HCT: 37.9 % (ref 36.0–46.0)
Hemoglobin: 11.7 g/dL — ABNORMAL LOW (ref 12.0–15.0)
Immature Granulocytes: 0 %
Lymphocytes Relative: 26 %
Lymphs Abs: 1.5 10*3/uL (ref 0.7–4.0)
MCH: 22.9 pg — ABNORMAL LOW (ref 26.0–34.0)
MCHC: 30.9 g/dL (ref 30.0–36.0)
MCV: 74.2 fL — ABNORMAL LOW (ref 80.0–100.0)
Monocytes Absolute: 0.4 10*3/uL (ref 0.1–1.0)
Monocytes Relative: 7 %
Neutro Abs: 3.9 10*3/uL (ref 1.7–7.7)
Neutrophils Relative %: 66 %
Platelets: 376 10*3/uL (ref 150–400)
RBC: 5.11 MIL/uL (ref 3.87–5.11)
RDW: 16.1 % — ABNORMAL HIGH (ref 11.5–15.5)
WBC: 5.9 10*3/uL (ref 4.0–10.5)
nRBC: 0 % (ref 0.0–0.2)

## 2019-04-10 LAB — COMPREHENSIVE METABOLIC PANEL
ALT: 22 U/L (ref 0–44)
AST: 22 U/L (ref 15–41)
Albumin: 4.3 g/dL (ref 3.5–5.0)
Alkaline Phosphatase: 31 U/L — ABNORMAL LOW (ref 38–126)
Anion gap: 10 (ref 5–15)
BUN: 13 mg/dL (ref 6–20)
CO2: 24 mmol/L (ref 22–32)
Calcium: 9.6 mg/dL (ref 8.9–10.3)
Chloride: 107 mmol/L (ref 98–111)
Creatinine, Ser: 0.92 mg/dL (ref 0.44–1.00)
GFR calc Af Amer: >60 mL/min (ref >60)
GFR calc non Af Amer: >60 mL/min (ref >60)
Glucose, Bld: 99 mg/dL (ref 70–99)
Potassium: 3.9 mmol/L (ref 3.5–5.1)
Sodium: 141 mmol/L (ref 135–145)
Total Bilirubin: 0.9 mg/dL (ref 0.3–1.2)
Total Protein: 7.9 g/dL (ref 6.5–8.1)

## 2019-04-10 LAB — URINALYSIS, ROUTINE W REFLEX MICROSCOPIC
Bilirubin Urine: NEGATIVE
Glucose, UA: NEGATIVE mg/dL
Ketones, ur: 15 mg/dL — AB
Leukocytes,Ua: NEGATIVE
Nitrite: NEGATIVE
Protein, ur: NEGATIVE mg/dL
Specific Gravity, Urine: >1.03 — ABNORMAL HIGH (ref 1.005–1.030)
pH: 5.5 (ref 5.0–8.0)

## 2019-04-10 LAB — URINALYSIS, MICROSCOPIC (REFLEX)

## 2019-04-10 LAB — PREGNANCY, URINE: Preg Test, Ur: NEGATIVE

## 2019-04-10 LAB — LIPASE, BLOOD: Lipase: 48 U/L (ref 11–51)

## 2019-04-10 LAB — TROPONIN I (HIGH SENSITIVITY)
Troponin I (High Sensitivity): <2 ng/L (ref <18)
Troponin I (High Sensitivity): <2 ng/L (ref <18)

## 2019-04-10 MED ORDER — SODIUM CHLORIDE 0.9 % IV BOLUS
1000.0000 mL | Freq: Once | INTRAVENOUS | Status: AC
  Administered 2019-04-10: 1000 mL via INTRAVENOUS

## 2019-04-10 NOTE — ED Provider Notes (Signed)
[facility] EMERGENCY DEPARTMENT Provider Note   CSN: [id number] Arrival date & time: 04/10/19  0840    History Chief Complaint  Patient presents with  . Chest Pain  . Diarrhea    Tabitha Peggs is a 44 y.o. female with no significant past medical history who presents for evaluation multiple complaints.  Patient states she has had an intermittent cough with intermittent chest tightness since she was given her Covid vaccine in January.  This was the Moderna vaccine.  Pain is nonexertional or pleuritic in nature.  No associated diaphoresis, nausea, vomiting, lightheadedness or syncope.  She denies any lateral leg swelling, redness, warmth, history of PE, DVT, recent surgery, immobilization.  Pain located to right and midsternal chest.  Last episode was yesterday around 9 PM which was 12 hours PTA.  She has no current pain.  Patient also admits to 3 episodes of diarrhea and loss of appetite which began yesterday.  Diet without melena or bright red blood per rectum.  She denies any fever, chills, nausea, vomiting, headache, lightheadedness, dizziness, abdominal pain, diarrhea, pelvic pain or vaginal discharge.  She has had no sick contacts.  She did have Covid in December.  Did not require hospitalization.  Has been using inhalers at home from prior episodes of bronchitis which has mildly helped with her symptoms.  She has a PCP however is not followed up with them.  She was seen here in the emergency department in January at onset of symptoms and was told to follow-up outpatient.  Denies additional aggravating or alleviating factors.  History obtained from patient and past medical record.  No interpreter is used.  HPI    No past medical history on file.  There are no problems to display for this patient.   Past Surgical History:  Procedure Laterality Date  . TUBAL LIGATION       OB History   No obstetric history on file.     No family history on file.  Social History    Tobacco Use  . Smoking status: Never Smoker  . Smokeless tobacco: Never Used  Substance Use Topics  . Alcohol use: No  . Drug use: No    Home Medications Prior to Admission medications   Medication Sig Start Date End Date Taking? Authorizing Provider  cetirizine (ZYRTEC ALLERGY) 10 MG tablet Take 1 tablet (10 mg total) by mouth daily. 07/21/14   Marisa Severin, MD  diphenhydrAMINE (BENADRYL) 25 MG tablet Take 1 tablet (25 mg total) by mouth every 6 (six) hours. 07/21/14   Marisa Severin, MD  famotidine (PEPCID) 20 MG tablet Take 1 tablet (20 mg total) by mouth 2 (two) times daily. 07/21/14   Marisa Severin, MD  ibuprofen (ADVIL) 600 MG tablet Take 1 tablet (600 mg total) by mouth every 6 (six) hours as needed for up to 30 doses. 01/07/19   Terald Sleeper, MD  ibuprofen (ADVIL,MOTRIN) 800 MG tablet Take 1 tablet (800 mg total) by mouth 3 (three) times daily. 12/01/17   Arby Barrette, MD  loratadine-pseudoephedrine (CLARITIN-D 12 HOUR) 5-120 MG tablet Take 1 tablet by mouth 2 (two) times daily. 12/01/17   Arby Barrette, MD  oxyCODONE-acetaminophen (PERCOCET/ROXICET) 5-325 MG tablet Take 2 tablets by mouth every 4 (four) hours as needed for up to 5 doses for severe pain. 01/07/19   Terald Sleeper, MD  predniSONE (DELTASONE) 20 MG tablet Take 3 tablets (60 mg total) by mouth daily. 07/21/14   Marisa Severin, MD  traMADol Janean Sark) 50  MG tablet Take 1 tablet (50 mg total) by mouth every 6 (six) hours as needed. 12/01/17   Arby Barrette, MD    Allergies    Patient has no known allergies.  Review of Systems   Review of Systems  Constitutional: Positive for appetite change. Negative for chills, diaphoresis, fatigue, fever and unexpected weight change.  HENT: Negative.   Cardiovascular: Positive for chest pain. Negative for palpitations and leg swelling.  All other systems reviewed and are negative.  Physical Exam Updated Vital Signs BP 112/82 (BP Location: Right Arm)   Pulse 80   Temp 98.2 F  (36.8 C) (Oral)   Resp 14   Ht 5\' 3"  (1.6 m)   Wt 94.3 kg   LMP 04/05/2019   SpO2 100%   BMI 36.85 kg/m   Physical Exam Vitals and nursing note reviewed.  Constitutional:      General: She is not in acute distress.    Appearance: She is not ill-appearing, toxic-appearing or diaphoretic.  HENT:     Head: Normocephalic and atraumatic.     Jaw: There is normal jaw occlusion.  Neck:     Vascular: No carotid bruit or JVD.     Trachea: Trachea and phonation normal.  Cardiovascular:     Rate and Rhythm: Normal rate.     Pulses: Normal pulses.          Radial pulses are 2+ on the right side and 2+ on the left side.       Dorsalis pedis pulses are 2+ on the right side and 2+ on the left side.       Posterior tibial pulses are 2+ on the right side and 2+ on the left side.     Heart sounds: Normal heart sounds.  Pulmonary:     Effort: Pulmonary effort is normal.     Breath sounds: Normal breath sounds and air entry.     Comments: Speaks in full sentences without difficulty.Lungs clear to auscultation.  Chest:    Abdominal:     General: Bowel sounds are normal.     Palpations: Abdomen is soft.     Tenderness: There is no abdominal tenderness. There is no right CVA tenderness, left CVA tenderness, guarding or rebound. Negative signs include Murphy's sign and McBurney's sign.     Hernia: No hernia is present.  Musculoskeletal:     Cervical back: Full passive range of motion without pain, normal range of motion and neck supple.     Comments: Moves all 4 extremities without difficulty.  Apartment soft.  04/07/2019' sign negative.  Skin:    Comments: No edema, erythema or warmth.  Neurological:     General: No focal deficit present.     Mental Status: She is alert.     Comments: Ambulatory in room without difficulty    ED Results / Procedures / Treatments   Labs (all labs ordered are listed, but only abnormal results are displayed) Labs Reviewed  CBC WITH DIFFERENTIAL/PLATELET -  Abnormal; Notable for the following components:      Result Value   Hemoglobin 11.7 (*)    MCV 74.2 (*)    MCH 22.9 (*)    RDW 16.1 (*)    All other components within normal limits  COMPREHENSIVE METABOLIC PANEL - Abnormal; Notable for the following components:   Alkaline Phosphatase 31 (*)    All other components within normal limits  URINALYSIS, ROUTINE W REFLEX MICROSCOPIC - Abnormal; Notable for the following components:  Specific Gravity, Urine >1.030 (*)    Hgb urine dipstick TRACE (*)    Ketones, ur 15 (*)    All other components within normal limits  URINALYSIS, MICROSCOPIC (REFLEX) - Abnormal; Notable for the following components:   Bacteria, UA MANY (*)    All other components within normal limits  LIPASE, BLOOD  PREGNANCY, URINE  TROPONIN I (HIGH SENSITIVITY)  TROPONIN I (HIGH SENSITIVITY)    EKG EKG Interpretation  Date/Time:  Friday April 10 2019 08:49:40 EDT Ventricular Rate:  93 PR Interval:    QRS Duration: 80 QT Interval:  368 QTC Calculation: 458 R Axis:   -9 Text Interpretation: Sinus rhythm Probable left atrial enlargement RSR' in V1 or V2, probably normal variant No significant change since last tracing Confirmed by Dorie Rank [phone]) on 04/10/2019 9:46:55 AM   Radiology DG Chest Portable 1 View  Result Date: 04/10/2019 CLINICAL DATA:  Chest pain.  Cough. EXAM: PORTABLE CHEST 1 VIEW COMPARISON:  01/19/2019. FINDINGS: Mediastinum and hilar structures normal. Mild left infrahilar infiltrate cannot be excluded no pleural effusion or pneumothorax. Heart size normal. Thoracic spine scoliosis and degenerative change. IMPRESSION: Mild left infrahilar infiltrate cannot be excluded Electronically Signed   By: Marcello Moores  Register   On: 04/10/2019 09:58    Procedures Procedures (including critical care time)  Medications Ordered in ED Medications  sodium chloride 0.9 % bolus 1,000 mL (0 mLs Intravenous Stopped 04/10/19 1048)    ED Course  I have reviewed the  triage vital signs and the nursing notes.  Pertinent labs & imaging results that were available during my care of the patient were reviewed by me and considered in my medical decision making (see chart for details).  44 year old female appears otherwise well presents for evaluation multiple complaints.  She is afebrile, nonseptic, not ill-appearing.  Intermittent exertional nonpleuritic chest pain since getting Covid vaccine in January.  Last episode 12 hours PTA.  She has no chest pain or shortness of breath currently.  Has been seen here once.  Denies of DVT on exam.  No unilateral leg swelling, redness, warmth, recent surgery or immobilization.  No prior history of PE or DVT.  No associated lightheadedness, dizziness or diaphoresis.  Heart and lungs clear.  She does have some mild right-sided chest wall tenderness where her pain is.  She does have any tachycardia, tachypnea or hypoxia.  She is PERC negative, Wells criteria low risk.  Patient also with 3 episodes of diarrhea without melena or bright blood per rectum.  Abdomen soft, nontender.  No emesis.  She appears well-hydrated.  No recent antibiotics, travel, suspicious food intake or any known sick contacts.  Plan on labs imaging reassess  Labs and imaging personally reviewed and interpreted:  Labs and imaging personally reviewed and interpreted. UA with some bacteria her she denies any urinary complaints will hold on antibiotics Delta troponin negative CBC without leukocytosis, hemoglobin at baseline Metabolic panel without electrolyte, renal abnormality Pregnancy test negative lipase 48 Chest x-ray without pneumothorax, cardiomegaly, pulmonary edema.  Possible cannot rule out infiltrate however patient denies any cough, shortness of breath or fever.  I low suspicion for bacterial etiology. EKG without ischemic changes  Patient reassessed. Denies any complaints at this time.  Tolerating p.o. intake.  On repeat exam patient does not have a  surgical abdomin and there are no peritoneal signs.  No indication of appendicitis, bowel obstruction, bowel perforation, cholecystitis, diverticulitis, AAA, torsion, PID or ectopic pregnancy.    She has not had any  tachycardia, tachypnea or hypoxia here in the ED continue low suspicion for PE.  Delta troponin negative.  Symptoms do not seem consistent with ACS.  She has a heart score of 0.  We will have her follow-up outpatient with her PCP.  Chest pain is not likely of cardiac or pulmonary etiology d/t presentation, PERC negative, VSS, no tracheal deviation, no JVD or new murmur, RRR, breath sounds equal bilaterally, EKG without acute abnormalities, negative troponin, and negative CXR. Pt has been advised to return to the ED if CP becomes exertional, associated with diaphoresis or nausea, radiates to left jaw/arm, worsens or becomes concerning in any way.   The patient has been appropriately medically screened and/or stabilized in the ED. I have low suspicion for any other emergent medical condition which would require further screening, evaluation or treatment in the ED or require inpatient management.  Patient is hemodynamically stable and in no acute distress.  Patient able to ambulate in department prior to ED.  Evaluation does not show acute pathology that would require ongoing or additional emergent interventions while in the emergency department or further inpatient treatment.  I have discussed the diagnosis with the patient and answered all questions.  Pain is been managed while in the emergency department and patient has no further complaints prior to discharge.  Patient is comfortable with plan discussed in room and is stable for discharge at this time.  I have discussed strict return precautions for returning to the emergency department.  Patient was encouraged to follow-up with PCP/specialist refer to at discharge.    MDM Rules/Calculators/A&P                       Final Clinical  Impression(s) / ED Diagnoses Final diagnoses:  Diarrhea, unspecified type  Atypical chest pain    Rx / DC Orders ED Discharge Orders    None       Vonnie Ligman A, PA-C 04/10/19 1333    Linwood Dibbles, MD 04/10/19 1438

## 2019-04-10 NOTE — ED Triage Notes (Signed)
Pt reports intermittent chest pain sive her covid vaccine 02/13/2019, worsening last night 10/10. Also reports diarrhea starting yesterday. No further symptoms.

## 2019-04-10 NOTE — Discharge Instructions (Addendum)
Return for new or worsening symptoms

## 2019-07-01 ENCOUNTER — Other Ambulatory Visit: Payer: Self-pay

## 2019-07-01 ENCOUNTER — Encounter (HOSPITAL_BASED_OUTPATIENT_CLINIC_OR_DEPARTMENT_OTHER): Payer: Self-pay | Admitting: *Deleted

## 2019-07-01 ENCOUNTER — Emergency Department (HOSPITAL_BASED_OUTPATIENT_CLINIC_OR_DEPARTMENT_OTHER)
Admission: EM | Admit: 2019-07-01 | Discharge: 2019-07-01 | Disposition: A | Discharge status: Home / Self Care | Payer: BC Managed Care – PPO | Attending: Emergency Medicine | Admitting: Emergency Medicine

## 2019-07-01 DIAGNOSIS — N390 Urinary tract infection, site not specified: Secondary | ICD-10-CM

## 2019-07-01 DIAGNOSIS — B9689 Other specified bacterial agents as the cause of diseases classified elsewhere: Secondary | ICD-10-CM | POA: Insufficient documentation

## 2019-07-01 DIAGNOSIS — R350 Frequency of micturition: Secondary | ICD-10-CM | POA: Insufficient documentation

## 2019-07-01 LAB — PREGNANCY, URINE: Preg Test, Ur: NEGATIVE

## 2019-07-01 LAB — URINALYSIS, ROUTINE W REFLEX MICROSCOPIC
Bilirubin Urine: NEGATIVE
Glucose, UA: NEGATIVE mg/dL
Ketones, ur: NEGATIVE mg/dL
Nitrite: NEGATIVE
Protein, ur: 30 mg/dL — AB
Specific Gravity, Urine: 1.025 (ref 1.005–1.030)
pH: 5.5 (ref 5.0–8.0)

## 2019-07-01 LAB — URINALYSIS, MICROSCOPIC (REFLEX): WBC, UA: >50 WBC/hpf (ref 0–5)

## 2019-07-01 MED ORDER — CEPHALEXIN 500 MG PO CAPS: 500.0000 mg | ORAL_CAPSULE | Freq: Three times a day (TID) | ORAL | 0 refills | Status: AC

## 2019-07-01 MED FILL — CEPHALEXIN 500 MG CAPSULE: 500 | 7 days supply | Qty: 21 | Fill #0

## 2019-07-01 NOTE — ED Triage Notes (Signed)
Frequent urination in the past 2 days.  Denies vaginal itching and burning during urination.

## 2019-07-01 NOTE — ED Provider Notes (Signed)
[facility] EMERGENCY DEPARTMENT Provider Note   CSN: [id number] Arrival date & time: 07/01/19  0854     History Chief Complaint  Patient presents with  . UTI    Tabitha Ritter is a 44 y.o. female.  Tabitha Ritter is a 44 y.o. female who is otherwise healthy, presents to the emergency department for evaluation of 2 days of urinary frequency.  She reports that she feels like she needs to go to the bathroom every few minutes but is only able to go a small amount.  She denies any burning or pain with urination.  Has not noted any hematuria.  No associated vaginal pain or irritation, no burning, no discharge or vaginal bleeding.  No associated abdominal or pelvic pain.  No flank pain.  No fevers or chills, no nausea or vomiting.  Had similar symptoms several years ago and was told she had a UTI and is worried that may be the case again today.  No meds prior to arrival.  No other aggravating or alleviating factors.        History reviewed. No pertinent past medical history.  There are no problems to display for this patient.   Past Surgical History:  Procedure Laterality Date  . TUBAL LIGATION       OB History    Gravida  3   Para  3   Term      Preterm      AB      Living        SAB      TAB      Ectopic      Multiple      Live Births              No family history on file.  Social History   Tobacco Use  . Smoking status: Never Smoker  . Smokeless tobacco: Never Used  Vaping Use  . Vaping Use: Never used  Substance Use Topics  . Alcohol use: No  . Drug use: No    Home Medications Prior to Admission medications   Not on File    Allergies    Patient has no known allergies.  Review of Systems   Review of Systems  Constitutional: Negative for chills and fever.  Gastrointestinal: Negative for abdominal pain, nausea and vomiting.  Genitourinary: Positive for frequency. Negative for dysuria, flank pain, hematuria, pelvic pain,  vaginal bleeding and vaginal discharge.  Musculoskeletal: Negative for back pain.  Skin: Negative for color change and rash.  All other systems reviewed and are negative.   Physical Exam Updated Vital Signs BP 131/79 (BP Location: Right Arm)   Pulse 93   Temp 97.9 F (36.6 C) (Oral)   Resp 16   Ht 5\' 3"  (1.6 m)   Wt 95.3 kg   SpO2 100%   BMI 37.20 kg/m   Physical Exam Vitals and nursing note reviewed.  Constitutional:      General: She is not in acute distress.    Appearance: Normal appearance. She is well-developed. She is not ill-appearing or diaphoretic.     Comments: Well appearing and in no distress  HENT:     Head: Normocephalic and atraumatic.  Eyes:     General:        Right eye: No discharge.        Left eye: No discharge.  Cardiovascular:     Rate and Rhythm: Normal rate and regular rhythm.     Heart sounds: Normal  heart sounds.  Pulmonary:     Effort: Pulmonary effort is normal. No respiratory distress.     Breath sounds: Normal breath sounds. No wheezing or rales.     Comments: Respirations equal and unlabored, patient able to speak in full sentences, lungs clear to auscultation bilaterally Abdominal:     General: Bowel sounds are normal. There is no distension.     Palpations: Abdomen is soft. There is no mass.     Tenderness: There is no abdominal tenderness. There is no guarding.     Comments: Abdomen soft, nondistended, nontender to palpation in all quadrants without guarding or peritoneal signs, no CVA tenderness bilaterally  Musculoskeletal:        General: No deformity.     Cervical back: Neck supple.  Skin:    General: Skin is warm and dry.     Capillary Refill: Capillary refill takes less than 2 seconds.  Neurological:     Mental Status: She is alert.     Coordination: Coordination normal.     Comments: Speech is clear, able to follow commands Moves extremities without ataxia, coordination intact  Psychiatric:        Mood and Affect: Mood  normal.        Behavior: Behavior normal.     ED Results / Procedures / Treatments   Labs (all labs ordered are listed, but only abnormal results are displayed) Labs Reviewed  URINALYSIS, ROUTINE W REFLEX MICROSCOPIC - Abnormal; Notable for the following components:      Result Value   APPearance CLOUDY (*)    Hgb urine dipstick MODERATE (*)    Protein, ur 30 (*)    Leukocytes,Ua LARGE (*)    All other components within normal limits  URINALYSIS, MICROSCOPIC (REFLEX) - Abnormal; Notable for the following components:   Bacteria, UA MANY (*)    All other components within normal limits  URINE CULTURE  PREGNANCY, URINE    EKG None  Radiology No results found.  Procedures Procedures (including critical care time)  Medications Ordered in ED Medications - No data to display  ED Course  I have reviewed the triage vital signs and the nursing notes.  Pertinent labs & imaging results that were available during my care of the patient were reviewed by me and considered in my medical decision making (see chart for details).    MDM Rules/Calculators/A&P                          Pt has been diagnosed with a UTI.  I personally reviewed and interpreted patient's urinalysis which shows large amount of leukocytes with many bacteria, with WBCs and RBCs present.  Pt is afebrile, no CVA tenderness, normotensive, and denies N/V. Pt to be dc home with antibiotics and instructions to follow up with PCP if symptoms persist.  Final Clinical Impression(s) / ED Diagnoses Final diagnoses:  Acute UTI    Rx / DC Orders ED Discharge Orders         Ordered    cephALEXin (KEFLEX) 500 MG capsule  3 times daily     Discontinue  Reprint     07/01/19 0948           Jacqlyn Larsen, PA-C 07/01/19 [id number]    Charlesetta Shanks, MD 07/02/19 1145

## 2019-07-01 NOTE — Discharge Instructions (Signed)
Take entire course of antibiotics as directed to treat UTI.  Make sure you are drinking plenty of water.  If you develop fevers, vomiting, severe flank or abdominal pain return to the ED for reevaluation otherwise follow-up with your PCP.

## 2019-07-03 LAB — URINE CULTURE: Culture: >=100000 — AB

## 2019-07-04 ENCOUNTER — Telehealth: Payer: Self-pay | Admitting: Emergency Medicine

## 2019-07-04 NOTE — Telephone Encounter (Signed)
Post ED Visit - Positive Culture Follow-up  Culture report reviewed by antimicrobial stewardship pharmacist: [facility] []  , Pharm.D. []  Enzo Bi, Pharm.D., BCPS AQ-ID []  , Pharm.D., BCPS []  Celedonio Miyamoto, .D., BCPS []  [place], .D., BCPS, AAHIVP []  Georgina Pillion, Pharm.D., BCPS, AAHIVP []  [address], PharmD, BCPS []  , PharmD, BCPS []  [place], PharmD, BCPS []  [address], PharmD []  , PharmD, BCPS []  Estella Husk, PharmD  Pharmacy Team []  Lysle Pearl, PharmD []  , PharmD []  Phillips Climes, PharmD []  , Rph []  Agapito Games) , PharmD []  Verlan Friends, PharmD []  , PharmD []  Mervyn Gay, PharmD []  , PharmD []  Vinnie Level, PharmD []  [facility], PharmD []  , PharmD []  Len Childs, PharmD   Positive urine culture Treated with cephalexin, organism sensitive to the same and no further patient follow-up is required at this time.  07/04/2019, 3:24 PM

## 2019-08-12 ENCOUNTER — Other Ambulatory Visit: Payer: Self-pay

## 2019-08-12 ENCOUNTER — Emergency Department (HOSPITAL_BASED_OUTPATIENT_CLINIC_OR_DEPARTMENT_OTHER)
Admission: EM | Admit: 2019-08-12 | Discharge: 2019-08-12 | Disposition: A | Discharge status: Home / Self Care | Payer: Self-pay | Attending: Emergency Medicine | Admitting: Emergency Medicine

## 2019-08-12 ENCOUNTER — Encounter (HOSPITAL_BASED_OUTPATIENT_CLINIC_OR_DEPARTMENT_OTHER): Payer: Self-pay

## 2019-08-12 DIAGNOSIS — N898 Other specified noninflammatory disorders of vagina: Secondary | ICD-10-CM

## 2019-08-12 DIAGNOSIS — N3000 Acute cystitis without hematuria: Secondary | ICD-10-CM

## 2019-08-12 DIAGNOSIS — L292 Pruritus vulvae: Secondary | ICD-10-CM | POA: Insufficient documentation

## 2019-08-12 LAB — WET PREP, GENITAL
Clue Cells Wet Prep HPF POC: NONE SEEN
Sperm: NONE SEEN
Trich, Wet Prep: NONE SEEN
Yeast Wet Prep HPF POC: NONE SEEN

## 2019-08-12 LAB — URINALYSIS, ROUTINE W REFLEX MICROSCOPIC
Bilirubin Urine: NEGATIVE
Glucose, UA: NEGATIVE mg/dL
Ketones, ur: NEGATIVE mg/dL
Nitrite: NEGATIVE
Protein, ur: NEGATIVE mg/dL
Specific Gravity, Urine: >1.03 — ABNORMAL HIGH (ref 1.005–1.030)
pH: 5 (ref 5.0–8.0)

## 2019-08-12 LAB — PREGNANCY, URINE: Preg Test, Ur: NEGATIVE

## 2019-08-12 LAB — URINALYSIS, MICROSCOPIC (REFLEX)

## 2019-08-12 MED ORDER — CEPHALEXIN 500 MG PO CAPS: 500.0000 mg | ORAL_CAPSULE | Freq: Two times a day (BID) | ORAL | 0 refills | Status: AC

## 2019-08-12 MED ORDER — NYSTATIN-TRIAMCINOLONE 100000-0.1 UNIT/GM-% EX CREA: TOPICAL_CREAM | CUTANEOUS | 0 refills | Status: AC

## 2019-08-12 MED FILL — NYSTATIN-TRIAMCINOLONE CRM: 100000-0.1 | 15 days supply | Qty: 30 | Fill #0

## 2019-08-12 MED FILL — CEPHALEXIN 500 MG CAPSULE: 500 | 5 days supply | Qty: 10 | Fill #0

## 2019-08-12 NOTE — Discharge Instructions (Addendum)
Urinalysis possibly shows a urinary tract infection.  Take antibiotic as prescribed.  Use prescription cream as directed as well.

## 2019-08-12 NOTE — ED Provider Notes (Signed)
[facility] EMERGENCY DEPARTMENT Provider Note   CSN: [id number] Arrival date & time: 08/12/19  7591     History Chief Complaint  Patient presents with  . Allergic Reaction    Tabitha Ritter is a 44 y.o. female.  The history is provided by the patient.  Allergic Reaction Presenting symptoms: itching (external vaginal itching since using new soap)   Presenting symptoms: no difficulty swallowing and no rash   Severity:  Mild Prior allergic episodes:  No prior episodes Context: new detergents/soaps   Relieved by:  Nothing Worsened by:  Nothing      History reviewed. No pertinent past medical history.  There are no problems to display for this patient.   Past Surgical History:  Procedure Laterality Date  . TUBAL LIGATION       OB History    Gravida  3   Para  3   Term      Preterm      AB      Living        SAB      TAB      Ectopic      Multiple      Live Births              No family history on file.  Social History   Tobacco Use  . Smoking status: Never Smoker  . Smokeless tobacco: Never Used  Vaping Use  . Vaping Use: Never used  Substance Use Topics  . Alcohol use: No  . Drug use: No    Home Medications Prior to Admission medications   Medication Sig Start Date End Date Taking? Authorizing Provider  cephALEXin (KEFLEX) 500 MG capsule Take 1 capsule (500 mg total) by mouth 2 (two) times daily for 5 days. 08/12/19 08/17/19  Virgina Norfolk, DO  nystatin-triamcinolone (MYCOLOG II) cream Apply to affected area twice a day after drying area well 08/12/19   Virgina Norfolk, DO    Allergies    Patient has no known allergies.  Review of Systems   Review of Systems  HENT: Negative for sore throat and trouble swallowing.   Genitourinary: Negative for decreased urine volume, difficulty urinating, dyspareunia, dysuria, enuresis, frequency, genital sores, hematuria, menstrual problem, pelvic pain, urgency, vaginal bleeding,  vaginal discharge and vaginal pain.  Musculoskeletal: Negative for arthralgias and back pain.  Skin: Positive for itching (external vaginal itching since using new soap). Negative for color change and rash.  All other systems reviewed and are negative.   Physical Exam Updated Vital Signs  ED Triage Vitals  Enc Vitals Group     BP 08/12/19 0938 (!) 119/86     Pulse Rate 08/12/19 0938 83     Resp 08/12/19 0938 16     Temp 08/12/19 0938 98.5 F (36.9 C)     Temp Source 08/12/19 0938 Oral     SpO2 08/12/19 0938 96 %     Weight 08/12/19 0938 (!) 210 lb (95.3 kg)     Height 08/12/19 0938 5\' 3"  (1.6 m)     Head Circumference --      Peak Flow --      Pain Score 08/12/19 0940 0     Pain Loc --      Pain Edu? --      Excl. in GC? --     Physical Exam Vitals and nursing note reviewed. Exam conducted with a chaperone present.  Constitutional:      General: She is not  in acute distress.    Appearance: She is not ill-appearing.  Genitourinary:    General: Normal vulva.     Exam position: Knee-chest position.     Pubic Area: No rash.      Labia:        Right: No rash, tenderness or lesion.        Left: No rash, tenderness or lesion.      Vagina: Vaginal discharge present.     Comments: No obvious signs of abscess, hives, drainage in GU area Skin:    Findings: No rash.  Neurological:     Mental Status: She is alert.     ED Results / Procedures / Treatments   Labs (all labs ordered are listed, but only abnormal results are displayed) Labs Reviewed  WET PREP, GENITAL - Abnormal; Notable for the following components:      Result Value   WBC, Wet Prep HPF POC MANY (*)    All other components within normal limits  URINALYSIS, ROUTINE W REFLEX MICROSCOPIC - Abnormal; Notable for the following components:   Specific Gravity, Urine >1.030 (*)    Hgb urine dipstick TRACE (*)    Leukocytes,Ua SMALL (*)    All other components within normal limits  URINALYSIS, MICROSCOPIC (REFLEX)  - Abnormal; Notable for the following components:   Bacteria, UA MANY (*)    All other components within normal limits  URINE CULTURE  PREGNANCY, URINE  GC/CHLAMYDIA PROBE AMP ([place]) NOT AT [facility]    EKG None  Radiology No results found.  Procedures Procedures (including critical care time)  Medications Ordered in ED Medications - No data to display  ED Course  I have reviewed the triage vital signs and the nursing notes.  Pertinent labs & imaging results that were available during my care of the patient were reviewed by me and considered in my medical decision making (see chart for details).    MDM Rules/Calculators/A&P                          Tabitha Ritter is a 44 year old female who presents to the ED with itching and near vaginal area.  Normal vitals.  No fever.  Thinks that she has itching from new soap.  No concern for STDs.  External GU exam is overall unremarkable.  Not having any vaginal discharge or bleeding.  Patient self swabbed for gonorrhea and chlamydia.  Wet prep was unremarkable.  Urinalysis with possible infection.  Will treat with antibiotics for UTI.  Will give nystatin/triamcinolone cream for vaginal irritation.  Given return precautions discharged in ED in good condition.  This chart was dictated using voice recognition software.  Despite best efforts to proofread,  errors can occur which can change the documentation meaning.    Final Clinical Impression(s) / ED Diagnoses Final diagnoses:  Itching in the vaginal area  Acute cystitis without hematuria    Rx / DC Orders ED Discharge Orders         Ordered    nystatin-triamcinolone (MYCOLOG II) cream     Discontinue  Reprint     08/12/19 1005    cephALEXin (KEFLEX) 500 MG capsule  2 times daily     Discontinue  Reprint     08/12/19 1121           Alexis Reber, DO 08/12/19 1146

## 2019-08-12 NOTE — ED Triage Notes (Signed)
Pt states she has been using a new soap for two days and is having vaginal inflation and irritation.

## 2019-08-12 NOTE — ED Triage Notes (Addendum)
All previous charting done by Dominic Pea. Only vital completed by [facility] EMT

## 2019-08-13 LAB — URINE CULTURE

## 2019-08-13 LAB — GC/CHLAMYDIA PROBE AMP (~~LOC~~) NOT AT ARMC
Chlamydia: NEGATIVE
Comment: NEGATIVE
Comment: NORMAL
Neisseria Gonorrhea: NEGATIVE

## 2020-05-26 ENCOUNTER — Other Ambulatory Visit: Payer: Self-pay

## 2020-05-26 ENCOUNTER — Ambulatory Visit (HOSPITAL_BASED_OUTPATIENT_CLINIC_OR_DEPARTMENT_OTHER): Admission: RE | Admit: 2020-05-26 | Payer: Managed Care, Other (non HMO) | Source: Ambulatory Visit

## 2020-05-26 ENCOUNTER — Emergency Department (HOSPITAL_BASED_OUTPATIENT_CLINIC_OR_DEPARTMENT_OTHER)
Admission: EM | Admit: 2020-05-26 | Discharge: 2020-05-26 | Disposition: A | Discharge status: Home / Self Care | Payer: Managed Care, Other (non HMO) | Source: Home / Self Care | Attending: Emergency Medicine | Admitting: Emergency Medicine

## 2020-05-26 ENCOUNTER — Encounter (HOSPITAL_BASED_OUTPATIENT_CLINIC_OR_DEPARTMENT_OTHER): Payer: Self-pay | Admitting: Emergency Medicine

## 2020-05-26 ENCOUNTER — Other Ambulatory Visit (HOSPITAL_BASED_OUTPATIENT_CLINIC_OR_DEPARTMENT_OTHER): Payer: Self-pay

## 2020-05-26 ENCOUNTER — Emergency Department (HOSPITAL_BASED_OUTPATIENT_CLINIC_OR_DEPARTMENT_OTHER)
Admission: EM | Admit: 2020-05-26 | Discharge: 2020-05-26 | Disposition: A | Discharge status: Home / Self Care | Payer: Managed Care, Other (non HMO) | Attending: Emergency Medicine | Admitting: Emergency Medicine

## 2020-05-26 ENCOUNTER — Emergency Department (HOSPITAL_BASED_OUTPATIENT_CLINIC_OR_DEPARTMENT_OTHER): Payer: Managed Care, Other (non HMO)

## 2020-05-26 ENCOUNTER — Encounter (HOSPITAL_BASED_OUTPATIENT_CLINIC_OR_DEPARTMENT_OTHER): Payer: Self-pay | Admitting: *Deleted

## 2020-05-26 DIAGNOSIS — R102 Pelvic and perineal pain: Secondary | ICD-10-CM | POA: Diagnosis not present

## 2020-05-26 DIAGNOSIS — R112 Nausea with vomiting, unspecified: Secondary | ICD-10-CM | POA: Insufficient documentation

## 2020-05-26 DIAGNOSIS — K5900 Constipation, unspecified: Secondary | ICD-10-CM | POA: Insufficient documentation

## 2020-05-26 DIAGNOSIS — R52 Pain, unspecified: Secondary | ICD-10-CM

## 2020-05-26 LAB — WET PREP, GENITAL
Clue Cells Wet Prep HPF POC: NONE SEEN
Sperm: NONE SEEN
Trich, Wet Prep: NONE SEEN
Yeast Wet Prep HPF POC: NONE SEEN

## 2020-05-26 LAB — URINALYSIS, MICROSCOPIC (REFLEX)

## 2020-05-26 LAB — URINALYSIS, ROUTINE W REFLEX MICROSCOPIC
Glucose, UA: NEGATIVE mg/dL
Ketones, ur: NEGATIVE mg/dL
Nitrite: NEGATIVE
Protein, ur: NEGATIVE mg/dL
Specific Gravity, Urine: >1.03 — ABNORMAL HIGH (ref 1.005–1.030)
pH: 5.5 (ref 5.0–8.0)

## 2020-05-26 LAB — PREGNANCY, URINE: Preg Test, Ur: NEGATIVE

## 2020-05-26 MED ORDER — LIDOCAINE HCL (PF) 1 % IJ SOLN
INTRAMUSCULAR | Status: AC
  Administered 2020-05-26: 5 mL via INTRAMUSCULAR
  Filled 2020-05-26: qty 5

## 2020-05-26 MED ORDER — IBUPROFEN 800 MG PO TABS
800.0000 mg | ORAL_TABLET | Freq: Once | ORAL | Status: AC
  Administered 2020-05-26: 800 mg via ORAL
  Filled 2020-05-26: qty 1

## 2020-05-26 MED ORDER — DOXYCYCLINE HYCLATE 100 MG PO CAPS
100.0000 mg | ORAL_CAPSULE | Freq: Two times a day (BID) | ORAL | 0 refills | Status: AC
  Filled 2020-05-26: qty 14, 7d supply, fill #0

## 2020-05-26 MED ORDER — CEFTRIAXONE SODIUM 500 MG IJ SOLR
500.0000 mg | Freq: Once | INTRAMUSCULAR | Status: AC
  Administered 2020-05-26: 500 mg via INTRAMUSCULAR
  Filled 2020-05-26: qty 500

## 2020-05-26 MED ORDER — ONDANSETRON 4 MG PO TBDP
4.0000 mg | ORAL_TABLET | Freq: Once | ORAL | Status: AC
  Administered 2020-05-26: 4 mg via ORAL
  Filled 2020-05-26: qty 1

## 2020-05-26 MED ORDER — ONDANSETRON 4 MG PO TBDP
4.0000 mg | ORAL_TABLET | Freq: Three times a day (TID) | ORAL | 0 refills | Status: AC | PRN
  Filled 2020-05-26: qty 20, 7d supply, fill #0

## 2020-05-26 MED ORDER — SODIUM CHLORIDE 0.9 % IV BOLUS: 1000.0000 mL | Freq: Once | INTRAVENOUS | Status: DC

## 2020-05-26 MED ORDER — ONDANSETRON HCL 4 MG/2ML IJ SOLN: 4.0000 mg | Freq: Once | INTRAMUSCULAR | Status: DC

## 2020-05-26 NOTE — ED Provider Notes (Signed)
[facility] EMERGENCY DEPARTMENT Provider Note   CSN: [id number] Arrival date & time: 05/26/20  0920     History Chief Complaint  Patient presents with  . Abdominal Pain    Tabitha Ritter is a 45 y.o. female.  The history is provided by the patient.  Abdominal Pain Pain location:  Suprapubic Pain quality: aching   Pain quality comment:  Cramping Pain radiates to:  Does not radiate Pain severity:  Moderate Onset quality:  Sudden Duration:  1 day Timing:  Constant Progression:  Waxing and waning Chronicity:  New Context comment:  Started when she was at school last night and she was having difficulty standing and bending over Relieved by: lying down. Worsened by:  Position changes (going from sitting to standing) Ineffective treatments:  None tried Associated symptoms: constipation   Associated symptoms: no anorexia, no cough, no diarrhea, no dysuria, no hematuria, no nausea, no vaginal bleeding and no vaginal discharge   Risk factors comment:  Healthy with tubal ligation      History reviewed. No pertinent past medical history.  There are no problems to display for this patient.   Past Surgical History:  Procedure Laterality Date  . TUBAL LIGATION       OB History    Gravida  3   Para  3   Term      Preterm      AB      Living        SAB      IAB      Ectopic      Multiple      Live Births              History reviewed. No pertinent family history.  Social History   Tobacco Use  . Smoking status: Never Smoker  . Smokeless tobacco: Never Used  Vaping Use  . Vaping Use: Never used  Substance Use Topics  . Alcohol use: No  . Drug use: No    Home Medications Prior to Admission medications   Medication Sig Start Date End Date Taking? Authorizing Provider  nystatin-triamcinolone (MYCOLOG II) cream Apply to affected area twice a day after drying area well 08/12/19   Virgina Norfolk, DO    Allergies    Patient has no  known allergies.  Review of Systems   Review of Systems  Respiratory: Negative for cough.   Gastrointestinal: Positive for abdominal pain and constipation. Negative for anorexia, diarrhea and nausea.  Genitourinary: Negative for dysuria, hematuria, vaginal bleeding and vaginal discharge.  All other systems reviewed and are negative.   Physical Exam Updated Vital Signs BP 117/82 (BP Location: Right Arm)   Pulse 94   Temp 98.6 F (37 C) (Oral)   Resp 16   Ht 5\' 3"  (1.6 m)   Wt 95.3 kg   LMP 04/29/2020 Comment: Negative u-preg today  SpO2 100%   BMI 37.20 kg/m   Physical Exam Vitals and nursing note reviewed.  Constitutional:      General: She is not in acute distress.    Appearance: She is well-developed.  HENT:     Head: Normocephalic and atraumatic.  Eyes:     Pupils: Pupils are equal, round, and reactive to light.  Cardiovascular:     Rate and Rhythm: Normal rate and regular rhythm.     Heart sounds: Normal heart sounds. No murmur heard. No friction rub.  Pulmonary:     Effort: Pulmonary effort is normal.  Breath sounds: Normal breath sounds. No wheezing or rales.  Abdominal:     General: Bowel sounds are normal. There is no distension.     Palpations: Abdomen is soft.     Tenderness: There is abdominal tenderness in the suprapubic area. There is no right CVA tenderness, left CVA tenderness, guarding or rebound.  Genitourinary:    Vagina: Vaginal discharge present.     Cervix: Discharge present.     Uterus: Normal.      Adnexa:        Right: Tenderness present. No fullness.         Left: Tenderness present. No fullness.       Comments: Copious amounts of thick green discharge Musculoskeletal:        General: No tenderness. Normal range of motion.     Comments: No edema  Skin:    General: Skin is warm and dry.     Findings: No rash.  Neurological:     Mental Status: She is alert and oriented to person, place, and time.     Cranial Nerves: No cranial  nerve deficit.  Psychiatric:        Behavior: Behavior normal.     ED Results / Procedures / Treatments   Labs (all labs ordered are listed, but only abnormal results are displayed) Labs Reviewed  WET PREP, GENITAL - Abnormal; Notable for the following components:      Result Value   WBC, Wet Prep HPF POC MANY (*)    All other components within normal limits  URINALYSIS, ROUTINE W REFLEX MICROSCOPIC - Abnormal; Notable for the following components:   APPearance CLOUDY (*)    Specific Gravity, Urine >1.030 (*)    Hgb urine dipstick SMALL (*)    Bilirubin Urine SMALL (*)    Leukocytes,Ua SMALL (*)    All other components within normal limits  URINALYSIS, MICROSCOPIC (REFLEX) - Abnormal; Notable for the following components:   Bacteria, UA MANY (*)    All other components within normal limits  PREGNANCY, URINE  GC/CHLAMYDIA PROBE AMP ([place]) NOT AT [facility]    EKG None  Radiology DG Abdomen 1 View  Result Date: 05/26/2020 CLINICAL DATA:  Lower abdominal pain and constipation EXAM: ABDOMEN - 1 VIEW COMPARISON:  None FINDINGS: Normal bowel gas pattern. Scattered stool throughout colon, normal stool burden. No bowel dilatation or bowel wall thickening. Osseous structures unremarkable. No urinary tract calcification. IMPRESSION: Normal exam. Electronically Signed   By: Ulyses Southward M.D.   On: 05/26/2020 10:36   US PELVIC COMPLETE W TRANSVAGINAL AND TORSION R/O  Result Date: 05/26/2020 CLINICAL DATA:  Pelvic and adnexal pain for 1 day, history tubal ligation. G3P3. LMP 04/29/2020 EXAM: TRANSABDOMINAL AND TRANSVAGINAL ULTRASOUND OF PELVIS DOPPLER ULTRASOUND OF OVARIES TECHNIQUE: Both transabdominal and transvaginal ultrasound examinations of the pelvis were performed. Transabdominal technique was performed for global imaging of the pelvis including uterus, ovaries, adnexal regions, and pelvic cul-de-sac. It was necessary to proceed with endovaginal exam following the transabdominal exam  to visualize the endometrium and adnexa. Color and duplex Doppler ultrasound was utilized to evaluate blood flow to the ovaries. COMPARISON:  None FINDINGS: Uterus Measurements: 8.7 x 4.9 x 5.6 cm = volume: 124 mL. Anteverted. Mildly heterogeneous myometrium. Probable intramural leiomyoma at LEFT upper uterus 19 mm diameter. Additional LEFT side leiomyoma mid uterus 2.2 cm diameter, subserosal. Tiny intramural leiomyoma upper RIGHT uterus 1.3 cm diameter. Endometrium Thickness: 10 mm.  No endometrial fluid or focal abnormality Right ovary  Measurements: 3.4 x 1.3 x 2.2 cm = volume: 5 mL. Normal morphology without mass. Internal blood flow present on color Doppler imaging. Left ovary Measurements: 3.0 x 2.4 x 2.9 cm = volume: 11 mL. Normal morphology without mass. Internal blood flow present on color Doppler imaging. Pulsed Doppler evaluation of both ovaries demonstrates normal low-resistance arterial and venous waveforms. Other findings Trace free pelvic fluid.  No adnexal masses. IMPRESSION: Three small uterine leiomyomata, largest 2.2 cm diameter. Remainder of exam unremarkable. No evidence of ovarian mass or torsion. Electronically Signed   By: Ulyses Southward M.D.   On: 05/26/2020 12:37    Procedures Procedures   Medications Ordered in ED Medications  ibuprofen (ADVIL) tablet 800 mg (800 mg Oral Given 05/26/20 2694)    ED Course  I have reviewed the triage vital signs and the nursing notes.  Pertinent labs & imaging results that were available during my care of the patient were reviewed by me and considered in my medical decision making (see chart for details).    MDM Rules/Calculators/A&P                          Patient presenting today with abdominal pain that started last night in her suprapubic region.  It seems to be worse with certain movements and positions.  She denies any dysuria, frequency or urgency.  No vaginal discharge or bleeding.  Patient has had a tubal ligation but no other  abdominal surgeries.  She has noted significant constipation in the last few weeks and did have a bowel movement yesterday but it was very hard.  Urine today is contaminated but has no significant signs of infection.  She is not having symptoms consistent with UTI.  Sexually Active with only 1 partner in the last 6 months and uses protection every time.  Urine pregnancy test is negative.  Will do a pelvic due to concern for ovarian cyst.  Low suspicion for kidney stone and hx not suggestive of torsion. On pelvic exam patient does have significant amount of discharge and does have bilateral pelvic tenderness.  Ultrasound pending to evaluate for tubo-ovarian abscess, ovarian cysts.  GC chlamydia and wet prep pending.  12:47 PM Wet prep with many white blood cells, ultrasound shows 3 small uterine leiomyomata as but no other acute findings.  Given patient's amount of discharge and discomfort will cover with Rocephin and doxycycline.  Encourage follow-up with OB/GYN if symptoms do not improve.  MDM Number of Diagnoses or Management Options   Amount and/or Complexity of Data Reviewed Clinical lab tests: ordered and reviewed Tests in the radiology section of CPT: ordered and reviewed Independent visualization of images, tracings, or specimens: yes     Final Clinical Impression(s) / ED Diagnoses Final diagnoses:  Pelvic pain in female    Rx / DC Orders ED Discharge Orders         Ordered    doxycycline (VIBRAMYCIN) 100 MG capsule  2 times daily        05/26/20 1249           Gwyneth Sprout, MD 05/26/20 1250

## 2020-05-26 NOTE — ED Triage Notes (Signed)
Pt given a shot of antibiotic 3 hours PTA. Reports vomit x 1 30 minutes ago.  Reports heart rate was 104 while vomiting. Denies pain/heart racing at this time.

## 2020-05-26 NOTE — ED Notes (Signed)
ED Provider at bedside. 

## 2020-05-26 NOTE — ED Triage Notes (Signed)
Pt arrives pov with c/o lower abdominal pain and urinary frequency that pt reports starting yesterday. Pt denies N/V, denies back pain, or dysuria

## 2020-05-26 NOTE — ED Provider Notes (Signed)
[facility] EMERGENCY DEPARTMENT Provider Note   CSN: [id number] Arrival date & time: 05/26/20  1621     History Chief Complaint  Patient presents with  . Allergic Reaction    Tabitha Ritter is a 45 y.o. female.  HPI      Tabitha Ritter is a 45 y.o. female, presenting to the ED with a single episode of nonbloody, nonbilious emesis approximately 30 minutes prior to arrival. Patient was seen in the ED earlier today due to pelvic pain, which has since resolved.  She was treated with IM Rocephin and prescribed doxycycline.  She had not yet taken the doxycycline. She was concerned about possible allergic reaction so she returned to the ED. She did not have nausea at the time of my assessment.  In fact, she had no current complaints at the time my assessment. Denies shortness of breath, throat swelling, rash, chest pain, extremity swelling, diarrhea, abdominal pain, syncope, or any other complaints.   History reviewed. No pertinent past medical history.  There are no problems to display for this patient.   Past Surgical History:  Procedure Laterality Date  . TUBAL LIGATION       OB History    Gravida  3   Para  3   Term      Preterm      AB      Living        SAB      IAB      Ectopic      Multiple      Live Births              History reviewed. No pertinent family history.  Social History   Tobacco Use  . Smoking status: Never Smoker  . Smokeless tobacco: Never Used  Vaping Use  . Vaping Use: Never used  Substance Use Topics  . Alcohol use: No  . Drug use: No    Home Medications Prior to Admission medications   Medication Sig Start Date End Date Taking? Authorizing Provider  ondansetron (ZOFRAN ODT) 4 MG disintegrating tablet Take 1 tablet (4 mg total) by mouth every 8 (eight) hours as needed for nausea or vomiting. 05/26/20  Yes Donevin Sainsbury C, PA-C  doxycycline (VIBRAMYCIN) 100 MG capsule Take 1 capsule (100 mg total) by  mouth 2 (two) times daily. 05/26/20   Gwyneth Sprout, MD  nystatin-triamcinolone (MYCOLOG II) cream Apply to affected area twice a day after drying area well 08/12/19   Virgina Norfolk, DO    Allergies    Patient has no known allergies.  Review of Systems   Review of Systems  Constitutional: Negative for chills, diaphoresis and fever.  HENT: Negative for facial swelling, sore throat, trouble swallowing and voice change.   Respiratory: Negative for cough and shortness of breath.   Cardiovascular: Negative for chest pain.  Gastrointestinal: Positive for nausea and vomiting. Negative for abdominal pain and diarrhea.  All other systems reviewed and are negative.   Physical Exam Updated Vital Signs BP 115/75 (BP Location: Right Arm)   Pulse 83   Temp 98.1 F (36.7 C) (Oral)   Resp (!) 21   Ht 5\' 3"  (1.6 m)   Wt 95.3 kg   LMP 04/29/2020 Comment: Negative u-preg today  SpO2 100%   BMI 37.20 kg/m   Physical Exam Vitals and nursing note reviewed.  Constitutional:      General: She is not in acute distress.    Appearance: She is well-developed. She  is not diaphoretic.  HENT:     Head: Normocephalic and atraumatic.     Mouth/Throat:     Mouth: Mucous membranes are moist.     Pharynx: Oropharynx is clear.     Comments: No trismus or noted abnormal phonation.  Mouth opening to at least 3 finger widths.  Handles oral secretions without difficulty.  No noted facial swelling.  No sublingual swelling or tongue elevation.  No swelling or tenderness to the submental or submandibular regions.  No swelling or tenderness into the soft tissues of the neck. Eyes:     Conjunctiva/sclera: Conjunctivae normal.  Cardiovascular:     Rate and Rhythm: Normal rate and regular rhythm.     Pulses: Normal pulses.          Radial pulses are 2+ on the right side and 2+ on the left side.       Posterior tibial pulses are 2+ on the right side and 2+ on the left side.     Heart sounds: Normal heart sounds.      Comments: Tactile temperature in the extremities appropriate and equal bilaterally. Pulmonary:     Effort: Pulmonary effort is normal. No respiratory distress.     Breath sounds: Normal breath sounds.  Abdominal:     Palpations: Abdomen is soft.     Tenderness: There is no abdominal tenderness. There is no guarding.  Musculoskeletal:     Cervical back: Neck supple.     Right lower leg: No edema.     Left lower leg: No edema.  Lymphadenopathy:     Cervical: No cervical adenopathy.  Skin:    General: Skin is warm and dry.     Comments: No noted hives or rash.  Neurological:     Mental Status: She is alert.  Psychiatric:        Mood and Affect: Mood and affect normal.        Speech: Speech normal.        Behavior: Behavior normal.     ED Results / Procedures / Treatments   Labs (all labs ordered are listed, but only abnormal results are displayed) Labs Reviewed - No data to display  EKG None  Radiology DG Abdomen 1 View  Result Date: 05/26/2020 CLINICAL DATA:  Lower abdominal pain and constipation EXAM: ABDOMEN - 1 VIEW COMPARISON:  None FINDINGS: Normal bowel gas pattern. Scattered stool throughout colon, normal stool burden. No bowel dilatation or bowel wall thickening. Osseous structures unremarkable. No urinary tract calcification. IMPRESSION: Normal exam. Electronically Signed   By: Ulyses Southward M.D.   On: 05/26/2020 10:36   US PELVIC COMPLETE W TRANSVAGINAL AND TORSION R/O  Result Date: 05/26/2020 CLINICAL DATA:  Pelvic and adnexal pain for 1 day, history tubal ligation. G3P3. LMP 04/29/2020 EXAM: TRANSABDOMINAL AND TRANSVAGINAL ULTRASOUND OF PELVIS DOPPLER ULTRASOUND OF OVARIES TECHNIQUE: Both transabdominal and transvaginal ultrasound examinations of the pelvis were performed. Transabdominal technique was performed for global imaging of the pelvis including uterus, ovaries, adnexal regions, and pelvic cul-de-sac. It was necessary to proceed with endovaginal exam  following the transabdominal exam to visualize the endometrium and adnexa. Color and duplex Doppler ultrasound was utilized to evaluate blood flow to the ovaries. COMPARISON:  None FINDINGS: Uterus Measurements: 8.7 x 4.9 x 5.6 cm = volume: 124 mL. Anteverted. Mildly heterogeneous myometrium. Probable intramural leiomyoma at LEFT upper uterus 19 mm diameter. Additional LEFT side leiomyoma mid uterus 2.2 cm diameter, subserosal. Tiny intramural leiomyoma upper RIGHT uterus 1.3 cm  diameter. Endometrium Thickness: 10 mm.  No endometrial fluid or focal abnormality Right ovary Measurements: 3.4 x 1.3 x 2.2 cm = volume: 5 mL. Normal morphology without mass. Internal blood flow present on color Doppler imaging. Left ovary Measurements: 3.0 x 2.4 x 2.9 cm = volume: 11 mL. Normal morphology without mass. Internal blood flow present on color Doppler imaging. Pulsed Doppler evaluation of both ovaries demonstrates normal low-resistance arterial and venous waveforms. Other findings Trace free pelvic fluid.  No adnexal masses. IMPRESSION: Three small uterine leiomyomata, largest 2.2 cm diameter. Remainder of exam unremarkable. No evidence of ovarian mass or torsion. Electronically Signed   By: Ulyses Southward M.D.   On: 05/26/2020 12:37    Procedures Procedures   Medications Ordered in ED Medications  ondansetron (ZOFRAN-ODT) disintegrating tablet 4 mg (4 mg Oral Given 05/26/20 1656)    ED Course  I have reviewed the triage vital signs and the nursing notes.  Pertinent labs & imaging results that were available during my care of the patient were reviewed by me and considered in my medical decision making (see chart for details).    MDM Rules/Calculators/A&P                          Patient presents with a single episode of vomiting.  No recurrence of her previous complaint of pelvic/abdominal pain. Patient is nontoxic appearing, afebrile, not tachycardic, not tachypneic, not hypotensive, excellent SPO2 on room  air, and is in no apparent distress.   I have reviewed the patient's chart to obtain more information.   I reviewed the labs and imaging studies from patient's visit earlier today. She does not present with signs of a systemic allergic reaction or anaphylaxis.  Tolerating PO here in the ED. The patient was given instructions for home care as well as return precautions. Patient voices understanding of these instructions, accepts the plan, and is comfortable with discharge.  Findings and plan of care discussed with attending physician, Chaney Malling, MD.    Vitals:   05/26/20 1628 05/26/20 1630 05/26/20 1653 05/26/20 1722  BP: 123/79  117/85 115/75  Pulse: 88  88 83  Resp: 18  14 (!) 21  Temp: 98.1 F (36.7 C)  98.1 F (36.7 C)   TempSrc: Oral  Oral   SpO2: 100%  100% 100%  Weight:  95.3 kg    Height:  5\' 3"  (1.6 m)       Final Clinical Impression(s) / ED Diagnoses Final diagnoses:  Non-intractable vomiting with nausea, unspecified vomiting type    Rx / DC Orders ED Discharge Orders         Ordered    ondansetron (ZOFRAN ODT) 4 MG disintegrating tablet  Every 8 hours PRN        05/26/20 1708           07/26/20, PA-C 05/26/20 1743    07/26/20, MD 05/26/20 2241

## 2020-05-26 NOTE — ED Notes (Signed)
No reaction after receiving rocephin

## 2020-05-26 NOTE — Discharge Instructions (Signed)
Avoid sex until symptoms resolve.  Take Tylenol and ibuprofen as needed for the pain.

## 2020-05-26 NOTE — ED Notes (Signed)
Pt ambulatory with steady gait to restroom, will provide urine specimen. Pt had tubal ligation

## 2020-05-26 NOTE — ED Notes (Signed)
Pt resting comfortably in bed, no complaints of pain at this time.

## 2020-05-26 NOTE — Discharge Instructions (Signed)
Nausea and Vomiting  Hand washing: Wash your hands throughout the day, but especially before and after touching the face, using the restroom, sneezing, coughing, or touching surfaces that have been coughed or sneezed upon. Hydration: Symptoms will be intensified and complicated by dehydration. Dehydration can also extend the duration of symptoms. Drink plenty of fluids and get plenty of rest. You should be drinking at least half a liter of water an hour to stay hydrated. Electrolyte drinks (ex. Gatorade, Powerade, Pedialyte) are also encouraged. You should be drinking enough fluids to make your urine light yellow, almost clear. If this is not the case, you are not drinking enough water. Please note that some of the treatments indicated below will not be effective if you are not adequately hydrated. Diet: Please concentrate on hydration, however, you may introduce food slowly.  Start with a clear liquid diet, progressed to a full liquid diet, and then bland solids as you are able. Pain or fever: Ibuprofen, Naproxen, or Tylenol for pain or fever.  Nausea/vomiting: Use the ondansetron (generic for Zofran) for nausea or vomiting.  This medication may not prevent all vomiting or nausea, but can help facilitate better hydration. Things that can help with nausea/vomiting also include peppermint/menthol candies, vitamin B12, and ginger. Follow-up: Follow-up with a primary care provider on this matter. Return: Return should you develop a fever, bloody diarrhea, increased abdominal pain, uncontrolled vomiting, shortness of breath, throat swelling, drooling, hives, or any other major concerns.  For prescription assistance, may try using prescription discount sites or apps, such as goodrx.com

## 2020-05-27 LAB — GC/CHLAMYDIA PROBE AMP (~~LOC~~) NOT AT ARMC
Chlamydia: NEGATIVE
Comment: NEGATIVE
Comment: NORMAL
Neisseria Gonorrhea: NEGATIVE

## 2020-08-22 ENCOUNTER — Other Ambulatory Visit: Payer: Self-pay

## 2020-08-22 ENCOUNTER — Emergency Department (HOSPITAL_BASED_OUTPATIENT_CLINIC_OR_DEPARTMENT_OTHER)
Admission: EM | Admit: 2020-08-22 | Discharge: 2020-08-22 | Disposition: A | Discharge status: Home / Self Care | Payer: Managed Care, Other (non HMO) | Attending: Emergency Medicine | Admitting: Emergency Medicine

## 2020-08-22 ENCOUNTER — Encounter (HOSPITAL_BASED_OUTPATIENT_CLINIC_OR_DEPARTMENT_OTHER): Payer: Self-pay

## 2020-08-22 DIAGNOSIS — R519 Headache, unspecified: Secondary | ICD-10-CM | POA: Diagnosis not present

## 2020-08-22 DIAGNOSIS — R110 Nausea: Secondary | ICD-10-CM | POA: Insufficient documentation

## 2020-08-22 DIAGNOSIS — M25511 Pain in right shoulder: Secondary | ICD-10-CM | POA: Insufficient documentation

## 2020-08-22 DIAGNOSIS — M25512 Pain in left shoulder: Secondary | ICD-10-CM | POA: Diagnosis not present

## 2020-08-22 NOTE — Discharge Instructions (Addendum)
[facility] today to be evaluated for your headache.  Your physical exam was reassuring.  Please follow-up with your primary care provider if you continue to have headaches.  Today you were prescribed Methocarbamol (Robaxin).  Methocarbamol (Robaxin) is used to treat muscle spasms/pain.  It works by helping to relax the muscles.  Drowsiness, dizziness, lightheadedness, stomach upset, nausea/vomiting, or blurred vision may occur.  Do not drive, use machinery, or do anything that needs alertness or clear vision until you can do it safely.  Do not combine this medication with alcoholic beverages, marijuana, or other central nervous system depressants.    Please take Ibuprofen (Advil, motrin) and Tylenol (acetaminophen) to relieve your pain.    You may take up to 600 MG (3 pills) of normal strength ibuprofen every 8 hours as needed.   You make take tylenol, up to 1,000 mg (two extra strength pills) every 8 hours as needed.   It is safe to take ibuprofen and tylenol at the same time as they work differently.   Do not take more than 3,000 mg tylenol in a 24 hour period (not more than one dose every 8 hours.  Please check all medication labels as many medications such as pain and cold medications may contain tylenol.  Do not drink alcohol while taking these medications.  Do not take other NSAID'S while taking ibuprofen (such as aleve or naproxen).  Please take ibuprofen with food to decrease stomach upset.  Get help right away if: Your headache becomes severe quickly. Your headache gets worse after moderate to intense physical activity. You have repeated vomiting. You have a stiff neck. You have a loss of vision. You have problems with speech. You have pain in the eye or ear. You have muscular weakness or loss of muscle control. You lose your balance or have trouble walking. You feel faint or pass out. You have confusion. You have a seizure.

## 2020-08-22 NOTE — ED Triage Notes (Signed)
"  Seen at [facility] ED Wednesday for headache and was diagnosed with migraine and given a couple of shots. The shots helped, but it has came back really bad, intermittent sharp in back of head, and nausea" per pt Denies history of migraines

## 2020-08-22 NOTE — ED Provider Notes (Signed)
[facility] EMERGENCY DEPARTMENT Provider Note   CSN: [id number] Arrival date & time: 08/22/20  1733     History Chief Complaint  Patient presents with   Headache    Tabitha Ritter is a 45 y.o. female with a history of tubal [place].  Patient presents emergency department with a chief complaint of headache.  Patient reports that she started having headaches on Wednesday. Been intermittent since then.  Patient went to [facility] emergency department on Friday and received migraine cocktail with improvement in her symptoms.  Patient states that headache came back yesterday.  Headache has been intermittent since then.  Patient reports that headache onset is gradual and pain becomes progressively worse over time.  Pain is located to occipital region of her head.  Patient rates pain 5/10 on the pain scale.  Patient denies any aggravating factors.  Patient reports minimal relief with ibuprofen 200 mg at 1300 today.  Patient reports nausea with headache earlier this morning.  Patient denies any recent falls or injuries.  Patient does endorse tightness to bilateral trapezius muscles.  Patient denies any visual disturbance, numbness, weakness, facial asymmetry, slurred speech, abdominal pain, vomiting, neck pain, neck stiffness, back pain.   Headache Associated symptoms: no abdominal pain, no back pain, no dizziness, no fever, no nausea, no neck pain, no neck stiffness, no numbness, no seizures, no vomiting and no weakness       History reviewed. No pertinent past medical history.  There are no problems to display for this patient.   Past Surgical History:  Procedure Laterality Date   TUBAL LIGATION       OB History     Gravida  3   Para  3   Term      Preterm      AB      Living         SAB      IAB      Ectopic      Multiple      Live Births              No family history on file.  Social History   Tobacco Use   Smoking status:  Never   Smokeless tobacco: Never  Vaping Use   Vaping Use: Never used  Substance Use Topics   Alcohol use: No   Drug use: No    Home Medications Prior to Admission medications   Medication Sig Start Date End Date Taking? Authorizing Provider  doxycycline (VIBRAMYCIN) 100 MG capsule Take 1 capsule (100 mg total) by mouth 2 (two) times daily. 05/26/20   Gwyneth Sprout, MD  nystatin-triamcinolone (MYCOLOG II) cream Apply to affected area twice a day after drying area well 08/12/19   Curatolo, Adam, DO  ondansetron (ZOFRAN ODT) 4 MG disintegrating tablet Take 1 tablet (4 mg total) by mouth every 8 (eight) hours as needed for nausea or vomiting. 05/26/20   Joy, Shawn C, PA-C    Allergies    Patient has no known allergies.  Review of Systems   Review of Systems  Constitutional:  Negative for chills and fever.  Eyes:  Negative for visual disturbance.  Respiratory:  Negative for shortness of breath.   Cardiovascular:  Negative for chest pain.  Gastrointestinal:  Negative for abdominal pain, nausea and vomiting.  Genitourinary:  Negative for difficulty urinating.  Musculoskeletal:  Negative for back pain, neck pain and neck stiffness.  Skin:  Negative for color change and rash.  Neurological:  Positive for headaches. Negative for dizziness, tremors, seizures, syncope, facial asymmetry, speech difficulty, weakness, light-headedness and numbness.  Psychiatric/Behavioral:  Negative for confusion.    Physical Exam Updated Vital Signs BP (!) 123/91 (BP Location: Right Arm)   Pulse 72   Temp 99 F (37.2 C) (Oral)   Resp 20   Ht 5\' 3"  (1.6 m)   Wt 91.2 kg   LMP 07/28/2020   SpO2 96%   BMI 35.61 kg/m   Physical Exam Vitals and nursing note reviewed.  Constitutional:      General: She is not in acute distress.    Appearance: She is not ill-appearing, toxic-appearing or diaphoretic.  Eyes:     General: No scleral icterus.       Right eye: No discharge.        Left eye: No  discharge.     Extraocular Movements: Extraocular movements intact.     Pupils: Pupils are equal, round, and reactive to light.  Cardiovascular:     Rate and Rhythm: Normal rate.  Pulmonary:     Effort: Pulmonary effort is normal. No tachypnea, bradypnea or respiratory distress.     Breath sounds: Normal breath sounds. No stridor.  Abdominal:     Palpations: Abdomen is soft.     Tenderness: There is no abdominal tenderness.  Musculoskeletal:     Cervical back: Normal range of motion and neck supple. No edema, erythema, signs of trauma, rigidity, torticollis or crepitus. No pain with movement, spinous process tenderness or muscular tenderness. Normal range of motion.  Skin:    General: Skin is warm and dry.  Neurological:     General: No focal deficit present.     Mental Status: She is alert and oriented to person, place, and time.     GCS: GCS eye subscore is 4. GCS verbal subscore is 5. GCS motor subscore is 6.     Cranial Nerves: No cranial nerve deficit or facial asymmetry.     Sensory: Sensation is intact.     Motor: No weakness, tremor, seizure activity or pronator drift.     Coordination: Romberg sign negative. Finger-Nose-Finger Test normal.     Gait: Gait is intact. Gait normal.     Comments: CN II-XII intact, equal grip strength, +5 strength to bilateral upper and lower extremities, sensation to light touch is intact to bilateral upper and lower extremities.  Psychiatric:        Behavior: Behavior is cooperative.    ED Results / Procedures / Treatments   Labs (all labs ordered are listed, but only abnormal results are displayed) Labs Reviewed - No data to display  EKG None  Radiology No results found.  Procedures Procedures   Medications Ordered in ED Medications - No data to display  ED Course  I have reviewed the triage vital signs and the nursing notes.  Pertinent labs & imaging results that were available during my care of the patient were reviewed by me  and considered in my medical decision making (see chart for details).    MDM Rules/Calculators/A&P                           Alert 45 year old female no acute distress, nontoxic appearing.  Presents with chief plaint of headache.  Low suspicion for [facility] as headache onset is gradual, pain is present worse over time, pain is not worse with exertion, no associated numbness, weakness, facial asymmetry, slurred speech.  Physical exam  patient has no focal neurological deficits.  Patient was offered migraine cocktail in emergency department and reassessment.  Patient defers at this time stating that she will go home and take over-the-counter medications.  Will prescribe patient with Robaxin to help with her trapezius muscle tenderness as this may be causing her headaches.  Discussed results, findings, treatment and follow up. Patient advised of return precautions. Patient verbalized understanding and agreed with plan.   Final Clinical Impression(s) / ED Diagnoses Final diagnoses:  Acute nonintractable headache, unspecified headache type    Rx / DC Orders ED Discharge Orders     None        Berneice Heinrich 08/22/20 1905    Charlynne Pander, MD 08/22/20 [phone]

## 2020-09-14 ENCOUNTER — Other Ambulatory Visit: Payer: Self-pay | Admitting: Family Medicine

## 2020-09-14 DIAGNOSIS — Z1231 Encounter for screening mammogram for malignant neoplasm of breast: Secondary | ICD-10-CM

## 2020-10-25 ENCOUNTER — Ambulatory Visit
Admission: RE | Admit: 2020-10-25 | Discharge: 2020-10-25 | Disposition: A | Discharge status: Home / Self Care | Payer: No Typology Code available for payment source | Source: Ambulatory Visit | Attending: Family Medicine | Admitting: Family Medicine

## 2020-10-25 ENCOUNTER — Other Ambulatory Visit: Payer: Self-pay

## 2020-10-25 DIAGNOSIS — Z1231 Encounter for screening mammogram for malignant neoplasm of breast: Secondary | ICD-10-CM

## 2020-10-31 ENCOUNTER — Other Ambulatory Visit: Payer: Self-pay | Admitting: Family Medicine

## 2020-10-31 DIAGNOSIS — R928 Other abnormal and inconclusive findings on diagnostic imaging of breast: Secondary | ICD-10-CM

## 2020-11-17 ENCOUNTER — Ambulatory Visit: Payer: No Typology Code available for payment source

## 2020-11-17 ENCOUNTER — Other Ambulatory Visit: Payer: No Typology Code available for payment source

## 2020-11-17 ENCOUNTER — Other Ambulatory Visit: Payer: Self-pay

## 2020-11-17 ENCOUNTER — Ambulatory Visit
Admission: RE | Admit: 2020-11-17 | Discharge: 2020-11-17 | Disposition: A | Discharge status: Home / Self Care | Payer: No Typology Code available for payment source | Source: Ambulatory Visit | Attending: Family Medicine | Admitting: Family Medicine

## 2020-11-17 DIAGNOSIS — R928 Other abnormal and inconclusive findings on diagnostic imaging of breast: Secondary | ICD-10-CM

## 2021-08-16 IMAGING — US US PELVIS COMPLETE TRANSABD/TRANSVAG W DUPLEX
1 series · 13 of 25 positions shown · non-contrast
Comparison: None

CLINICAL DATA: Pelvic and adnexal pain for 1 day, history tubal
ligation. G3P3. LMP 04/29/2020

EXAM:
TRANSABDOMINAL AND TRANSVAGINAL ULTRASOUND OF PELVIS
DOPPLER ULTRASOUND OF OVARIES
TECHNIQUE: Both transabdominal and transvaginal ultrasound examinations of the
pelvis were performed. Transabdominal technique was performed for
global imaging of the pelvis including uterus, ovaries, adnexal
regions, and pelvic cul-de-sac.
It was necessary to proceed with endovaginal exam following the
transabdominal exam to visualize the endometrium and adnexa. Color
and duplex Doppler ultrasound was utilized to evaluate blood flow to
the ovaries.

[Series 1: us pelvis complete transabd/transvag w duplex · 13 of 102 slices shown]
[im 1/102]
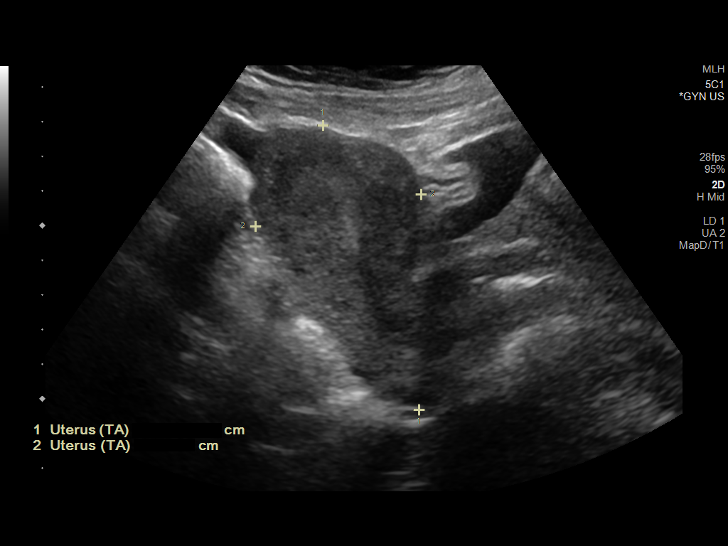
[im 9/102]
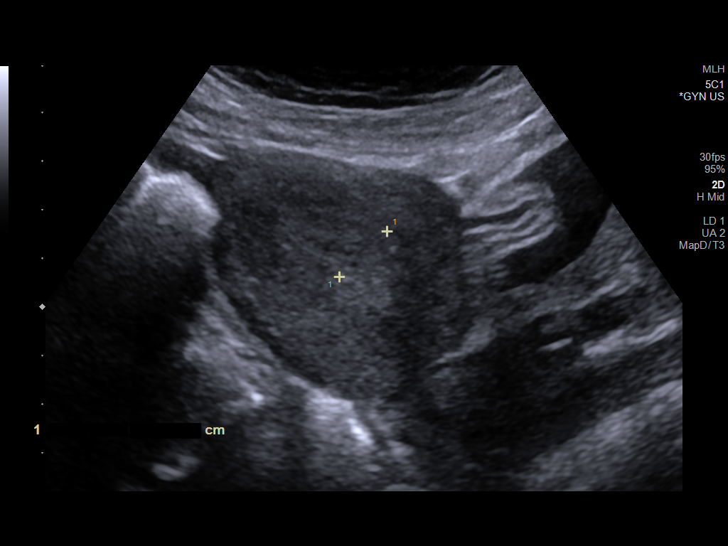
[im 17/102]
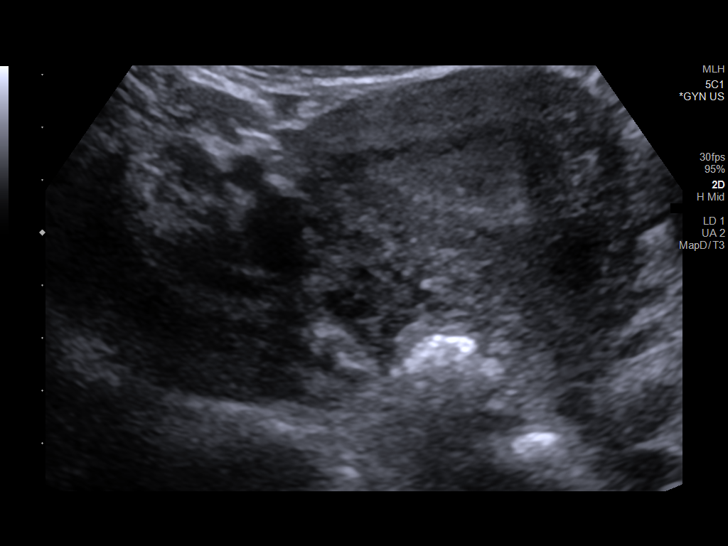
[im 26/102]
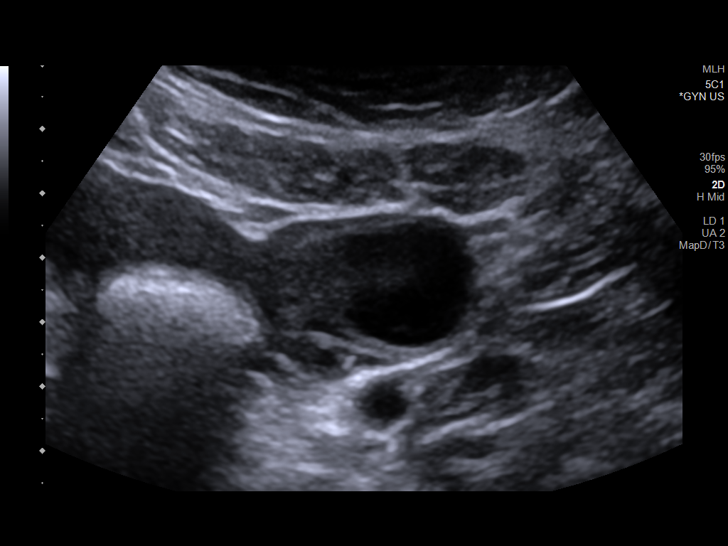
[im 34/102]
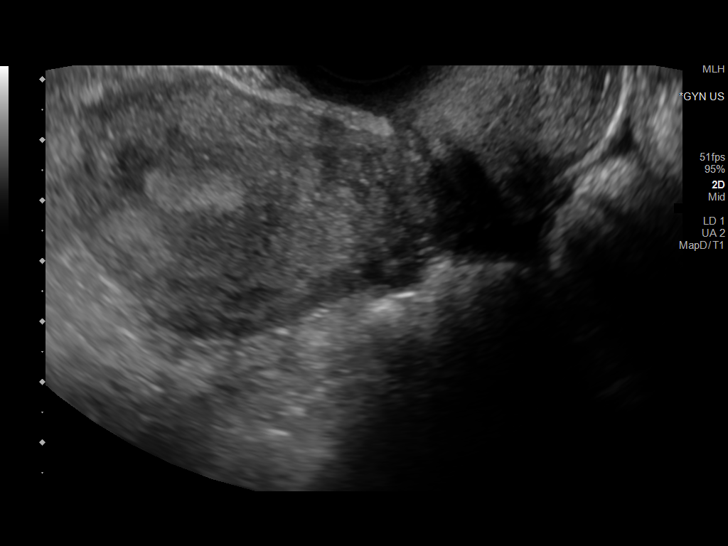
[im 43/102]
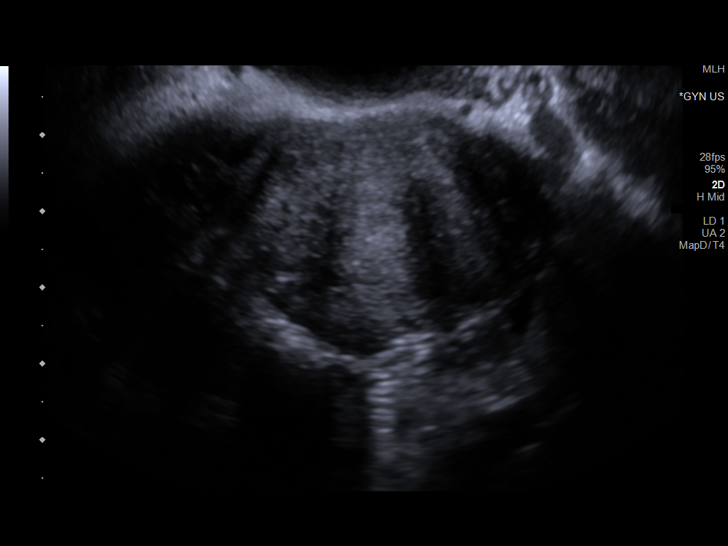
[im 51/102]
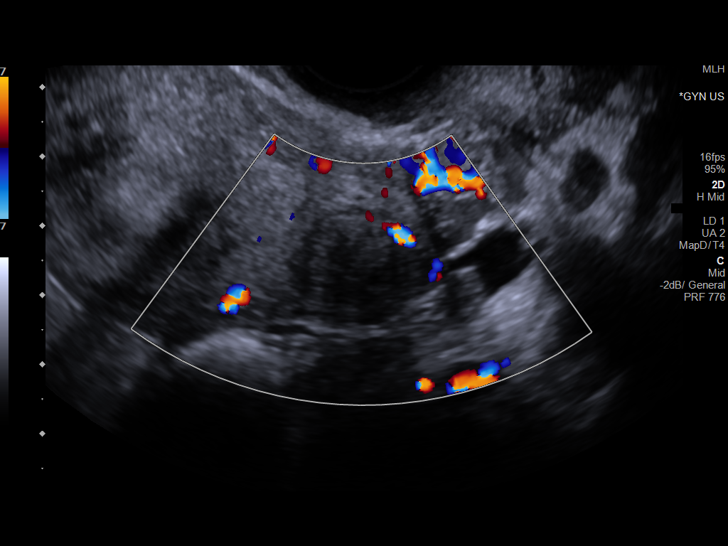
[im 59/102]
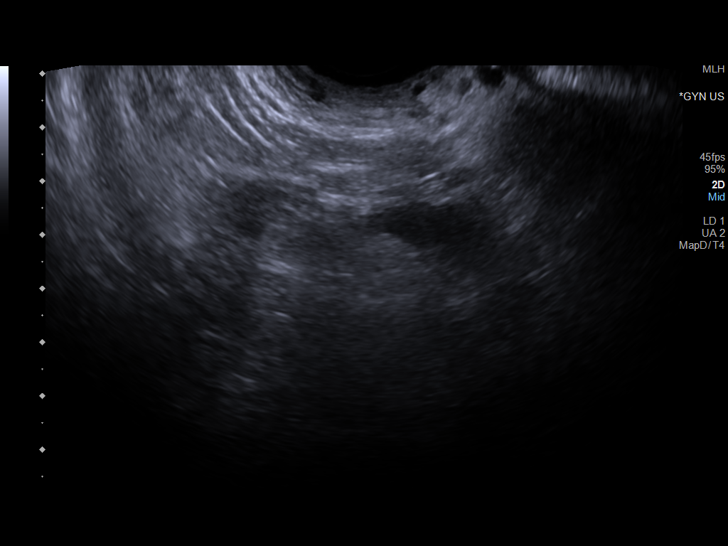
[im 68/102]
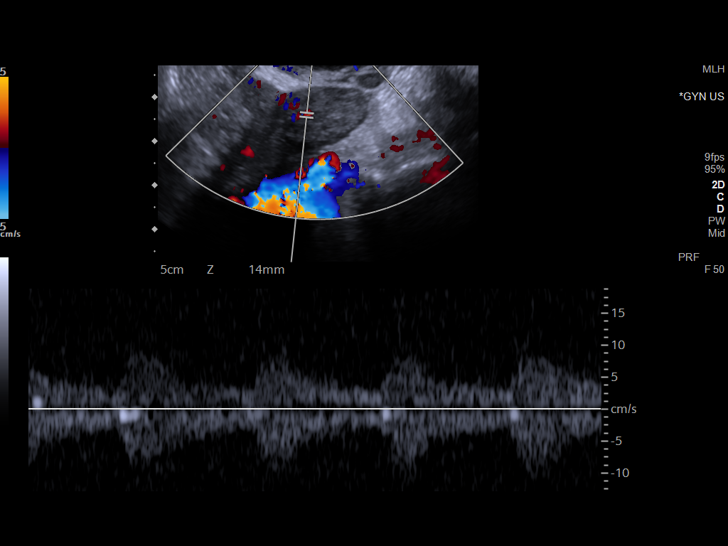
[im 76/102]
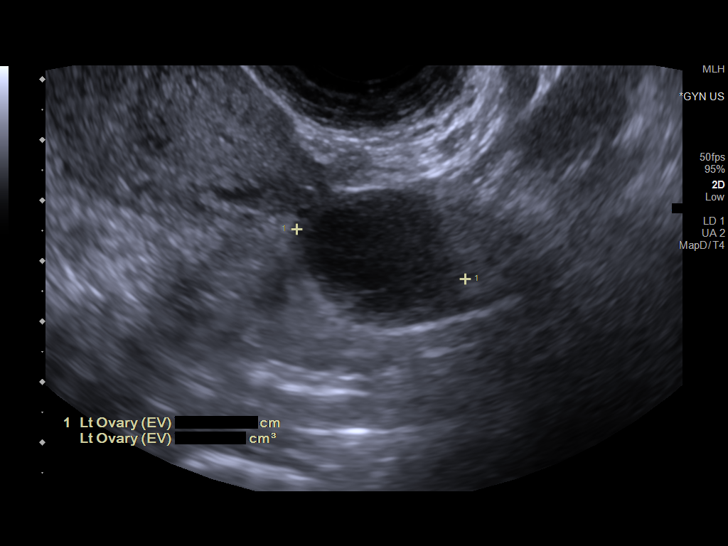
[im 85/102]
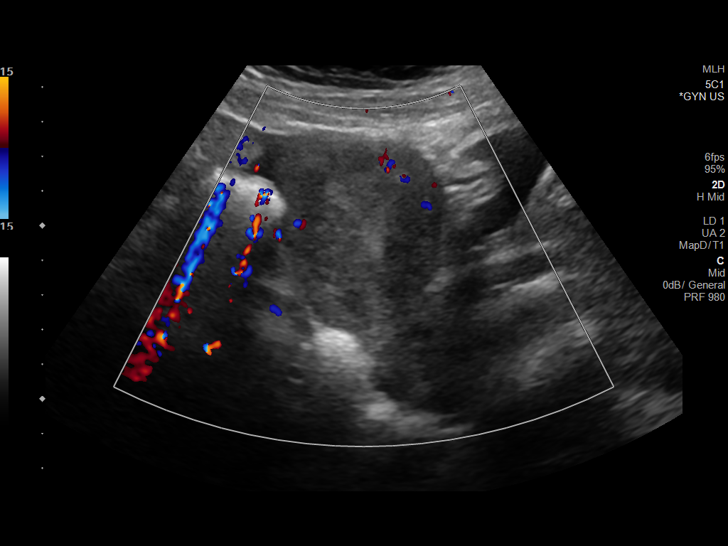
[im 93/102]
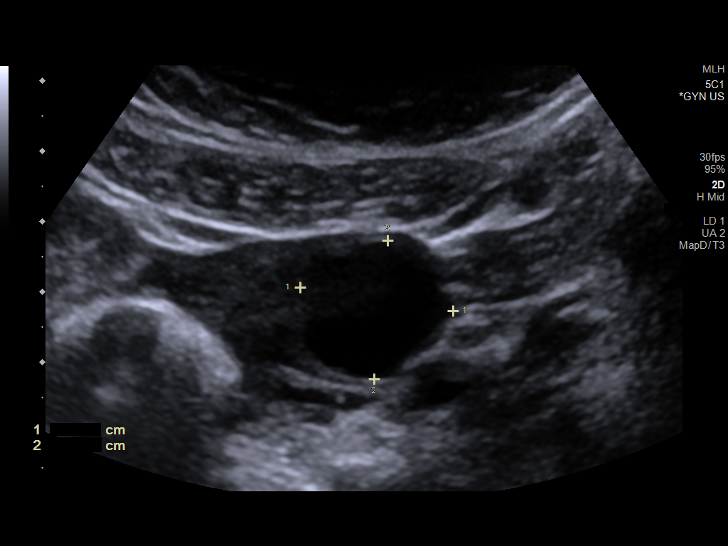
[im 102/102]
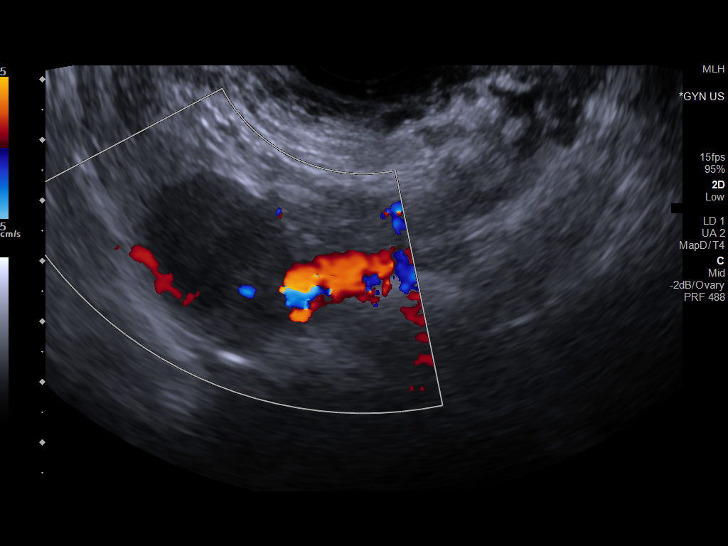

[13 of 25 positions shown; findings below may reference images not displayed]

FINDINGS: Uterus

Measurements: 8.7 x 4.9 x 5.6 cm = volume: 124 mL. Anteverted.
Mildly heterogeneous myometrium. Probable intramural leiomyoma at
LEFT upper uterus 19 mm diameter. Additional LEFT side leiomyoma mid
uterus 2.2 cm diameter, subserosal. Tiny intramural leiomyoma upper
RIGHT uterus 1.3 cm diameter.

Endometrium

Thickness: 10 mm.  No endometrial fluid or focal abnormality

Right ovary

Measurements: 3.4 x 1.3 x 2.2 cm = volume: 5 mL. Normal morphology
without mass. Internal blood flow present on color Doppler imaging.

Left ovary

Measurements: 3.0 x 2.4 x 2.9 cm = volume: 11 mL. Normal morphology
without mass. Internal blood flow present on color Doppler imaging.

Pulsed Doppler evaluation of both ovaries demonstrates normal
low-resistance arterial and venous waveforms.

Other findings

Trace free pelvic fluid.  No adnexal masses.
IMPRESSION: Three small uterine leiomyomata, largest 2.2 cm diameter.

Remainder of exam unremarkable.

No evidence of ovarian mass or torsion.

## 2021-11-20 ENCOUNTER — Other Ambulatory Visit: Payer: Self-pay | Admitting: Family Medicine

## 2021-11-20 DIAGNOSIS — Z1231 Encounter for screening mammogram for malignant neoplasm of breast: Secondary | ICD-10-CM

## 2021-11-22 ENCOUNTER — Ambulatory Visit
Admission: RE | Admit: 2021-11-22 | Discharge: 2021-11-22 | Disposition: A | Discharge status: Home / Self Care | Payer: PRIVATE HEALTH INSURANCE | Source: Ambulatory Visit | Attending: Family Medicine | Admitting: Family Medicine

## 2021-11-22 DIAGNOSIS — Z1231 Encounter for screening mammogram for malignant neoplasm of breast: Secondary | ICD-10-CM

## 2021-11-29 ENCOUNTER — Encounter (HOSPITAL_BASED_OUTPATIENT_CLINIC_OR_DEPARTMENT_OTHER): Payer: Self-pay

## 2021-11-29 ENCOUNTER — Emergency Department (HOSPITAL_BASED_OUTPATIENT_CLINIC_OR_DEPARTMENT_OTHER)
Admission: EM | Admit: 2021-11-29 | Discharge: 2021-11-29 | Disposition: A | Discharge status: Home / Self Care | Payer: PRIVATE HEALTH INSURANCE | Attending: Emergency Medicine | Admitting: Emergency Medicine

## 2021-11-29 ENCOUNTER — Other Ambulatory Visit: Payer: Self-pay

## 2021-11-29 DIAGNOSIS — H5789 Other specified disorders of eye and adnexa: Secondary | ICD-10-CM | POA: Diagnosis present

## 2021-11-29 DIAGNOSIS — H1032 Unspecified acute conjunctivitis, left eye: Secondary | ICD-10-CM | POA: Insufficient documentation

## 2021-11-29 MED ORDER — GENTAMICIN SULFATE 0.3 % OP SOLN: 1.0000 [drp] | OPHTHALMIC | 0 refills | Status: AC

## 2021-11-29 NOTE — ED Notes (Signed)
RN provided AVS using Teachback Method. Patient verbalizes understanding of Discharge Instructions. Opportunity for Questioning and Answers were provided by RN. Patient Discharged from ED ambulatory to Home via Self.  

## 2021-11-29 NOTE — ED Provider Notes (Signed)
[facility] EMERGENCY DEPT Provider Note   CSN: [id number] Arrival date & time: 11/29/21  1353     History  Chief Complaint  Patient presents with   Eye Problem    Tabitha Ritter is a 46 y.o. female.  46 year old female presents with complaint of left eye redness and irritation x2 days.  Does wear contact lenses, denies any injury to the eye or visual disturbance.  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  States that her eye was crusted shut in the morning, is watery.       Home Medications Prior to Admission medications   Medication Sig Start Date End Date Taking? Authorizing Provider  gentamicin (GARAMYCIN) 0.3 % ophthalmic solution Place 1 drop into the left eye every 4 (four) hours. 11/29/21  Yes Jeannie Fend, PA-C  doxycycline (VIBRAMYCIN) 100 MG capsule Take 1 capsule (100 mg total) by mouth 2 (two) times daily. 05/26/20   Gwyneth Sprout, MD  nystatin-triamcinolone (MYCOLOG II) cream Apply to affected area twice a day after drying area well 08/12/19   Curatolo, Adam, DO  ondansetron (ZOFRAN ODT) 4 MG disintegrating tablet Take 1 tablet (4 mg total) by mouth every 8 (eight) hours as needed for nausea or vomiting. 05/26/20   Joy, Shawn C, PA-C      Allergies    Patient has no known allergies.    Review of Systems   Review of Systems Negative except as per HPI Physical Exam Updated Vital Signs BP 123/81 (BP Location: Right Arm)   Pulse 80   Temp 98.4 F (36.9 C)   Resp 16   Ht 5\' 3"  (1.6 m)   Wt 95.3 kg   SpO2 100%   BMI 37.20 kg/m  Physical Exam Vitals and nursing note reviewed.  Constitutional:      General: She is not in acute distress.    Appearance: She is well-developed. She is not diaphoretic.  HENT:     Head: Normocephalic and atraumatic.     Mouth/Throat:     Mouth: Mucous membranes are moist.  Eyes:     Extraocular Movements: Extraocular movements intact.     Conjunctiva/sclera:     Right eye:  Right conjunctiva is not injected.     Left eye: Left conjunctiva is injected. No chemosis, exudate or hemorrhage.    Pupils: Pupils are equal, round, and reactive to light.  Pulmonary:     Effort: Pulmonary effort is normal.  Skin:    General: Skin is warm and dry.     Findings: No erythema or rash.  Neurological:     Mental Status: She is alert and oriented to person, place, and time.  Psychiatric:        Behavior: Behavior normal.     ED Results / Procedures / Treatments   Labs (all labs ordered are listed, but only abnormal results are displayed) Labs Reviewed - No data to display  EKG None  Radiology No results found.  Procedures Procedures    Medications Ordered in ED Medications - No data to display  ED Course/ Medical Decision Making/ A&P                           Medical Decision Making  46 year old female with left eye redness and irritation x2 days.  Reports eye matted shut in the morning with watery drainage.  Does wear contact lenses.  Does have mild conjunctival injection, no active drainage  at this time.  Anterior chamber appears we are/calm.  Advised dispose of current contact lens, do not wear contacts again until infection has resolved.  Prescribed antibiotic drops although possibly also viral or allergic in origin.  Follow-up with PCP if not improving.        Final Clinical Impression(s) / ED Diagnoses Final diagnoses:  Acute conjunctivitis of left eye, unspecified acute conjunctivitis type    Rx / DC Orders ED Discharge Orders          Ordered    gentamicin (GARAMYCIN) 0.3 % ophthalmic solution  Every 4 hours        11/29/21 1430              Jeannie Fend, PA-C 11/29/21 1433    Glyn Ade, MD 11/30/21 [phone]

## 2021-11-29 NOTE — Discharge Instructions (Signed)
Use drops as prescribed.  Remove your contact lens, do not wear a contact lens in this eye until infection has resolved.  Be sure to throw this contact lens away.  Recheck with your doctor if symptoms or not improving.

## 2021-11-29 NOTE — ED Triage Notes (Signed)
Pt c/o redness, drainage and irritation to left eye for the past few days.

## 2022-02-07 IMAGING — MG MM DIGITAL DIAGNOSTIC UNILAT*R* W/ TOMO W/ CAD
4 series · 4 of 12 positions shown · non-contrast
Comparison: Previous exam(s).

CLINICAL DATA: Recall from screening mammography, possible focal
asymmetry in the UPPER INNER QUADRANT of the RIGHT breast at
anterior depth.

EXAM:
DIGITAL DIAGNOSTIC UNILATERAL RIGHT MAMMOGRAM WITH TOMOSYNTHESIS AND
CAD
TECHNIQUE: Right digital diagnostic mammography and breast tomosynthesis was
performed. The images were evaluated with computer-aided detection.

[R MLO synth-2D]
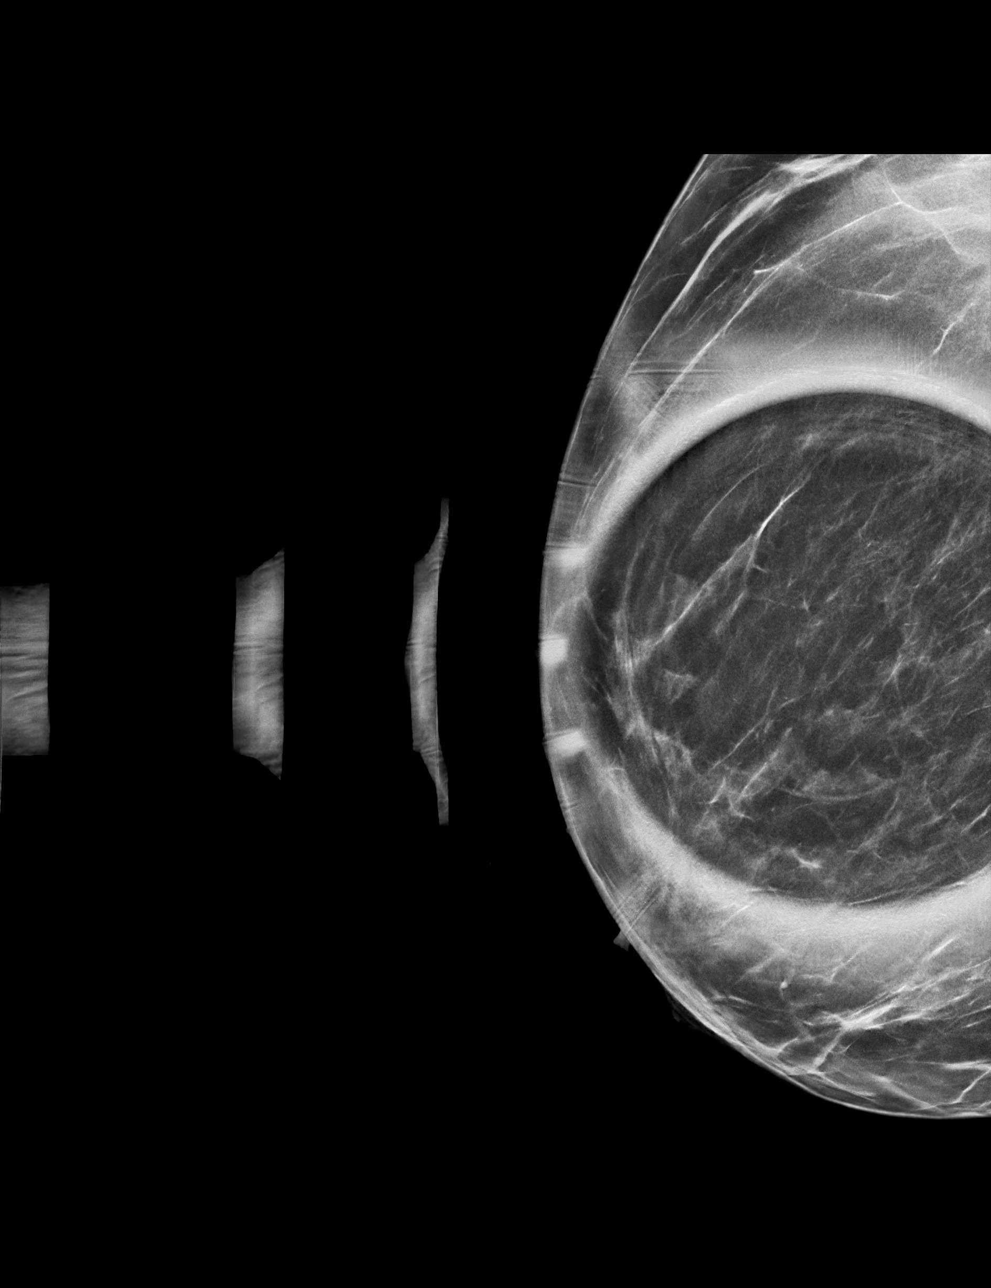

[R CC synth-2D]
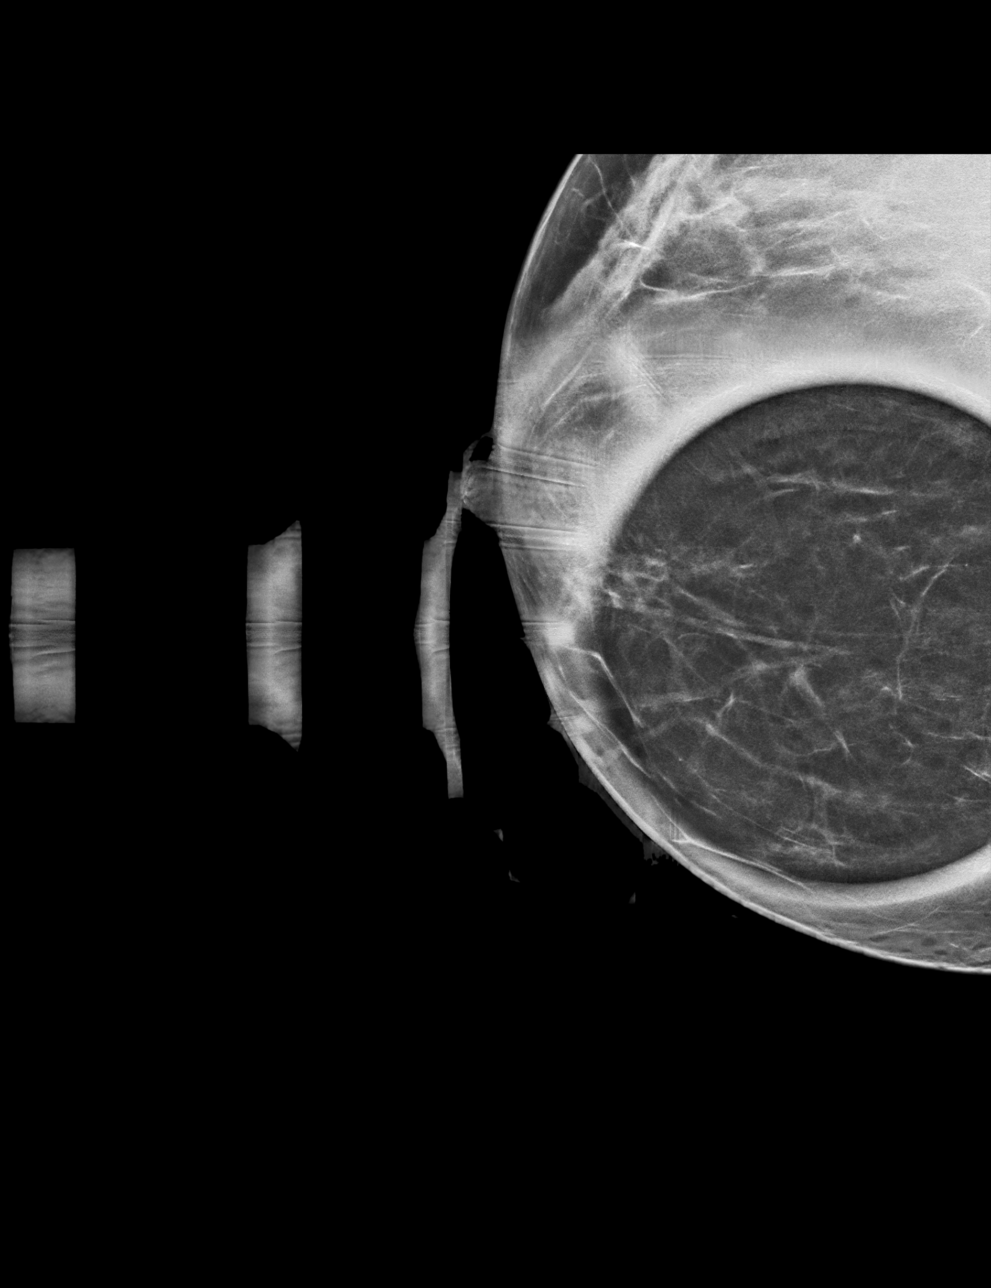

[R MLO tomo · tomo slice 34/67.0]
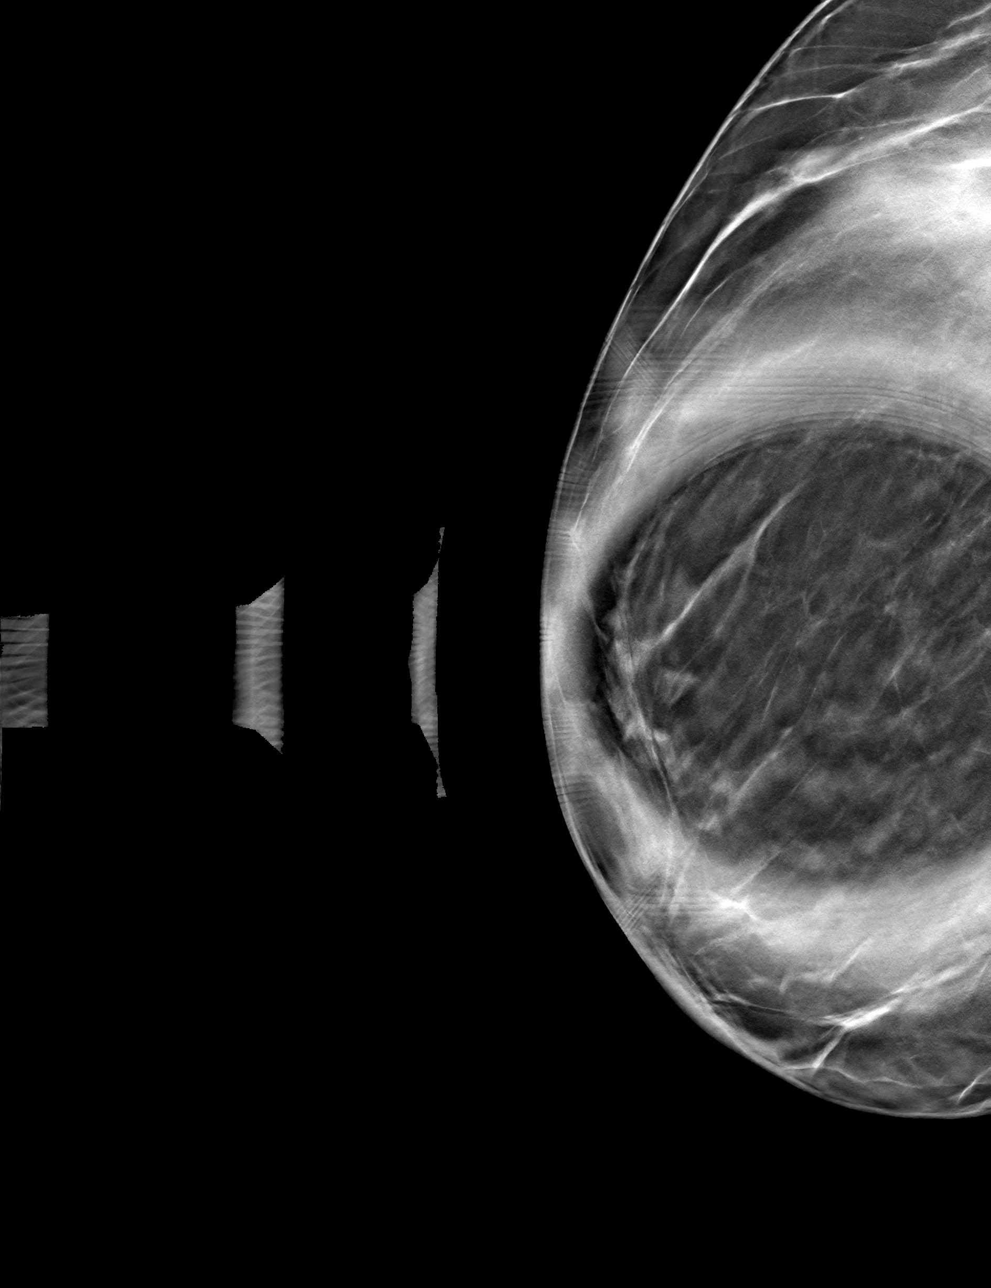

[R CC tomo · tomo slice 27/53.0]
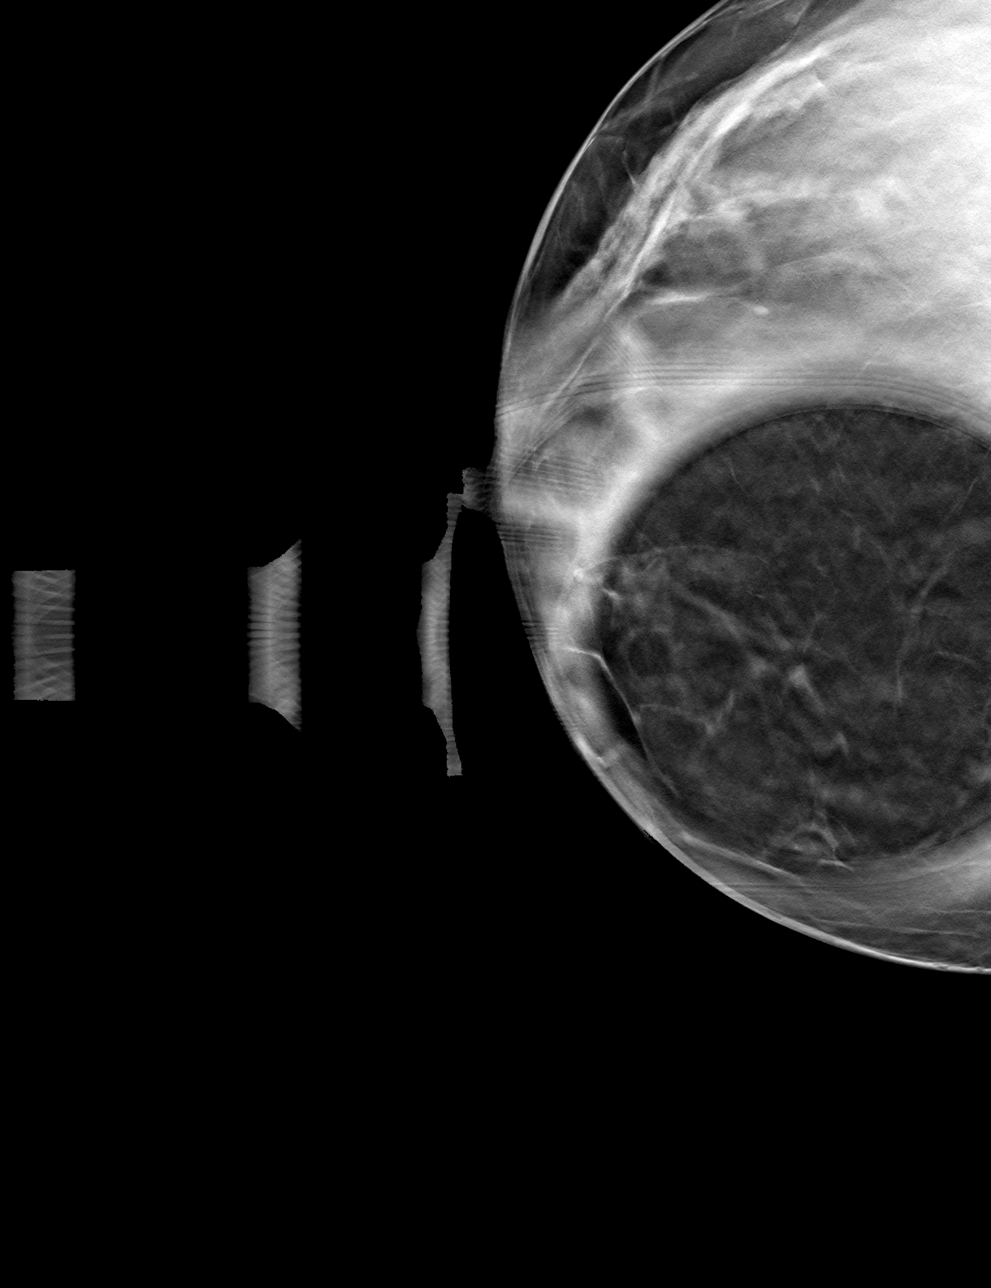

[4 of 12 positions shown; findings below may reference images not displayed]

ACR Breast Density Category b: There are scattered areas of
fibroglandular density.
FINDINGS: Spot-compression CC and MLO views of the area of concern were
obtained.

The focal asymmetry in the UPPER INNER QUADRANT questioned on
screening mammography disperses with compression, indicating an
island of fibroglandular tissue. There is no underlying mass or
architectural distortion.
IMPRESSION: No mammographic evidence of malignancy involving the RIGHT breast.

RECOMMENDATION:
Screening mammogram in one year.(Code:[id number]-V-ZQ3)

I have discussed the findings and recommendations with the patient.
If applicable, a reminder letter will be sent to the patient
regarding the next appointment.

BI-RADS CATEGORY  1: Negative.

## 2022-11-09 ENCOUNTER — Emergency Department (HOSPITAL_BASED_OUTPATIENT_CLINIC_OR_DEPARTMENT_OTHER)
Admission: EM | Admit: 2022-11-09 | Discharge: 2022-11-09 | Disposition: A | Discharge status: Home / Self Care | Payer: PRIVATE HEALTH INSURANCE | Attending: Emergency Medicine | Admitting: Emergency Medicine

## 2022-11-09 ENCOUNTER — Other Ambulatory Visit: Payer: Self-pay

## 2022-11-09 ENCOUNTER — Encounter (HOSPITAL_BASED_OUTPATIENT_CLINIC_OR_DEPARTMENT_OTHER): Payer: Self-pay

## 2022-11-09 DIAGNOSIS — R519 Headache, unspecified: Secondary | ICD-10-CM | POA: Insufficient documentation

## 2022-11-09 MED ORDER — METOCLOPRAMIDE HCL 5 MG/ML IJ SOLN
10.0000 mg | Freq: Once | INTRAMUSCULAR | Status: AC
  Administered 2022-11-09: 10 mg via INTRAVENOUS
  Filled 2022-11-09: qty 2

## 2022-11-09 MED ORDER — DIPHENHYDRAMINE HCL 50 MG/ML IJ SOLN
25.0000 mg | Freq: Once | INTRAMUSCULAR | Status: AC
  Administered 2022-11-09: 25 mg via INTRAVENOUS
  Filled 2022-11-09: qty 1

## 2022-11-09 MED ORDER — KETOROLAC TROMETHAMINE 15 MG/ML IJ SOLN
15.0000 mg | Freq: Once | INTRAMUSCULAR | Status: AC
  Administered 2022-11-09: 15 mg via INTRAVENOUS
  Filled 2022-11-09: qty 1

## 2022-11-09 MED ORDER — DEXAMETHASONE SODIUM PHOSPHATE 10 MG/ML IJ SOLN
10.0000 mg | Freq: Once | INTRAMUSCULAR | Status: AC
  Administered 2022-11-09: 10 mg via INTRAVENOUS
  Filled 2022-11-09: qty 1

## 2022-11-09 NOTE — Discharge Instructions (Addendum)
As discussed, visit emergency department today overall reassuring.  Suspect your symptoms are likely secondary to migraine type headache.  Will recommend follow-up with primary care for reassessment of your symptoms.  Please do not hesitate to return to emergency department for worrisome signs and symptoms we discussed become apparent.

## 2022-11-09 NOTE — ED Provider Notes (Signed)
[place] EMERGENCY DEPARTMENT AT [facility] Provider Note   CSN: [id number] Arrival date & time: 11/09/22  4098     History  Chief Complaint  Patient presents with   Headache    Tabitha Ritter is a 47 y.o. female.  HPI   47 year old female presents emergency department with complaints of headache.  Patient with left frontal headache.  States that headache has been there for the past week.  States she has a history of headache and this feels somewhat similar.  Reports gradual onset a week ago with acute worsening 2 days after headache began.  States that she took Aleve and headache did improve but still persisted.  Given persistent headache, prompted visit to the emergency department.  Denies any fever, visual disturbance, gait abnormality, slurred speech, facial droop, weakness/sensory deficits in upper lower extremities.  No significant pertinent past medical history.  Home Medications Prior to Admission medications   Medication Sig Start Date End Date Taking? Authorizing Provider  doxycycline (VIBRAMYCIN) 100 MG capsule Take 1 capsule (100 mg total) by mouth 2 (two) times daily. 05/26/20   Gwyneth Sprout, MD  gentamicin (GARAMYCIN) 0.3 % ophthalmic solution Place 1 drop into the left eye every 4 (four) hours. 11/29/21   Jeannie Fend, PA-C  nystatin-triamcinolone (MYCOLOG II) cream Apply to affected area twice a day after drying area well 08/12/19   Curatolo, Adam, DO  ondansetron (ZOFRAN ODT) 4 MG disintegrating tablet Take 1 tablet (4 mg total) by mouth every 8 (eight) hours as needed for nausea or vomiting. 05/26/20   Joy, Shawn C, PA-C      Allergies    Patient has no known allergies.    Review of Systems   Review of Systems  All other systems reviewed and are negative.   Physical Exam Updated Vital Signs BP 121/81 (BP Location: Right Arm)   Pulse 72   Temp 98.3 F (36.8 C) (Oral)   Resp 18   Ht 5\' 3"  (1.6 m)   Wt 98 kg   SpO2 97%   BMI 38.26  kg/m  Physical Exam Vitals and nursing note reviewed.  Constitutional:      General: She is not in acute distress.    Appearance: She is well-developed.  HENT:     Head: Normocephalic and atraumatic.  Eyes:     Conjunctiva/sclera: Conjunctivae normal.  Cardiovascular:     Rate and Rhythm: Normal rate and regular rhythm.     Heart sounds: No murmur heard. Pulmonary:     Effort: Pulmonary effort is normal. No respiratory distress.     Breath sounds: Normal breath sounds.  Abdominal:     Palpations: Abdomen is soft.     Tenderness: There is no abdominal tenderness.  Musculoskeletal:        General: No swelling.     Cervical back: Neck supple.  Skin:    General: Skin is warm and dry.     Capillary Refill: Capillary refill takes less than 2 seconds.  Neurological:     Mental Status: She is alert.     Comments: Alert and oriented to self, place, time and event.   Speech is fluent, clear without dysarthria or dysphasia.   Strength 5/5 in upper/lower extremities   Sensation intact in upper/lower extremities   Normal gait.  Negative Romberg. No pronator drift.  Normal finger-to-nose and feet tapping.  CN I not tested  CN II grossly intact visual fields bilaterally. Did not visualize posterior eye.  CN III,  IV, VI PERRLA and EOMs intact bilaterally  CN V Intact sensation to sharp and light touch to the face  CN VII facial movements symmetric  CN VIII not tested  CN IX, X no uvula deviation, symmetric rise of soft palate  CN XI 5/5 SCM and trapezius strength bilaterally  CN XII Midline tongue protrusion, symmetric L/R movements   Psychiatric:        Mood and Affect: Mood normal.     ED Results / Procedures / Treatments   Labs (all labs ordered are listed, but only abnormal results are displayed) Labs Reviewed - No data to display  EKG None  Radiology No results found.  Procedures Procedures    Medications Ordered in ED Medications  metoCLOPramide (REGLAN)  injection 10 mg (10 mg Intravenous Given 11/09/22 0947)  ketorolac (TORADOL) 15 MG/ML injection 15 mg (15 mg Intravenous Given 11/09/22 0948)  dexamethasone (DECADRON) injection 10 mg (10 mg Intravenous Given 11/09/22 0946)  diphenhydrAMINE (BENADRYL) injection 25 mg (25 mg Intravenous Given 11/09/22 0948)    ED Course/ Medical Decision Making/ A&P Clinical Course as of 11/09/22 1108  Fri Nov 09, 2022  1023 Reevaluation of the patient showed resolution of headache.  Will discharge. [CR]    Clinical Course User Index [CR] Peter Garter, PA                                 Medical Decision Making Risk Prescription drug management.   This patient presents to the ED for concern of headache, this involves an extensive number of treatment options, and is a complaint that carries with it a high risk of complications and morbidity.  The differential diagnosis includes migraine/tension/cluster headache, CVA, cerebral venous thrombosis, carotid artery/vertebral artery dissection, malignancy, giant cell arteritis, pseudotumor cerebri, other   Co morbidities that complicate the patient evaluation  See HPI   Additional history obtained:  Additional history obtained from EMR External records from outside source obtained and reviewed including hospital records   Lab Tests:  N/a   Imaging Studies ordered:  N/a   Cardiac Monitoring: / EKG:  The patient was maintained on a cardiac monitor.  I personally viewed and interpreted the cardiac monitored which showed an underlying rhythm of: Sinus rhythm   Consultations Obtained:  N/a   Problem List / ED Course / Critical interventions / Medication management  Headache I ordered medication including Benadryl, Reglan, Toradol, Decadron  Reevaluation of the patient after these medicines showed that the patient improved I have reviewed the patients home medicines and have made adjustments as needed   Social Determinants of  Health:  Denies tobacco, illicit drug use   Test / Admission - Considered:  Headache Vitals signs within normal range and stable throughout visit. 47 year old female presents emergency department with complaints of headache.  Headache present for about a week now less severe than onset.  Patient with gradual onset headache with worsening a few days after headache began that is now tapered off and is less severe.  Patient with nonfocal neurologic exam; low suspicion for cerebral venous thrombosis, CVA.  Patient without fever, nuchal rigidity; low suspicion for meningitis/encephalitis.  Patient without neck pain radiating to head/acute sudden onset headache; low suspicion for aneurysm/arterial dissection.  Patient without temporal tenderness, ocular symptoms; low suspicion for giant cell arteritis patient treated with migraine cocktail and noted resolution of headache.  Suspect patient's symptoms are likely secondary to migraine headache  given history of headaches in the past with dull headache presently.  Will recommend close follow-up with primary care in the outpatient setting.  Treatment plan discussed at length with patient and she acknowledged understanding was agreeable to said plan.  Patient overall well-appearing, afebrile in no acute distress. Worrisome signs and symptoms were discussed with the patient, and the patient acknowledged understanding to return to the ED if noticed. Patient was stable upon discharge.          Final Clinical Impression(s) / ED Diagnoses Final diagnoses:  Nonintractable headache, unspecified chronicity pattern, unspecified headache type    Rx / DC Orders ED Discharge Orders     None         Peter Garter, [place] 11/09/22 1108    Alvira Monday, MD 11/12/22 [phone]

## 2022-11-09 NOTE — ED Triage Notes (Signed)
Onset last Friday of frontal headache.  States was very painful for two days and now continues at a lower level.  Not light sensitive.

## 2023-09-13 ENCOUNTER — Other Ambulatory Visit: Payer: Self-pay | Admitting: Family Medicine

## 2023-09-13 DIAGNOSIS — Z1231 Encounter for screening mammogram for malignant neoplasm of breast: Secondary | ICD-10-CM

## 2023-09-20 ENCOUNTER — Ambulatory Visit
Admission: RE | Admit: 2023-09-20 | Discharge: 2023-09-20 | Disposition: A | Discharge status: Home / Self Care | Payer: Self-pay | Source: Ambulatory Visit | Attending: Family Medicine | Admitting: Family Medicine

## 2023-09-20 DIAGNOSIS — Z1231 Encounter for screening mammogram for malignant neoplasm of breast: Secondary | ICD-10-CM
